# Patient Record
Sex: Male | Born: 1985 | Race: Black or African American | Hispanic: No | Marital: Single | Smoking: Never smoker
Health system: Southern US, Community
[De-identification: ages and names within clinical notes are randomized; demographics above are authoritative.]

---

## 2013-05-30 ENCOUNTER — Emergency Department (HOSPITAL_COMMUNITY)
Admission: EM | Admit: 2013-05-30 | Discharge: 2013-05-30 | Disposition: A | Discharge status: Home / Self Care | Payer: Self-pay | Source: Home / Self Care | Attending: Family Medicine | Admitting: Family Medicine

## 2013-05-30 ENCOUNTER — Encounter (HOSPITAL_COMMUNITY): Payer: Self-pay | Admitting: Emergency Medicine

## 2013-05-30 DIAGNOSIS — R21 Rash and other nonspecific skin eruption: Secondary | ICD-10-CM

## 2013-05-30 MED ORDER — TERBINAFINE HCL 250 MG PO TABS: 250.0000 mg | ORAL_TABLET | Freq: Every day | ORAL | Status: DC

## 2013-05-30 MED ORDER — PREDNISONE 10 MG PO TABS: ORAL_TABLET | ORAL | Status: DC

## 2013-05-30 MED ORDER — CETIRIZINE HCL 10 MG PO TABS: 10.0000 mg | ORAL_TABLET | Freq: Every day | ORAL | Status: AC

## 2013-05-30 NOTE — ED Provider Notes (Signed)
Medical screening examination/treatment/procedure(s) were performed by a resident physician or non-physician practitioner and as the supervising physician I was immediately available for consultation/collaboration.  Clementeen Graham, MD   Rodolph Bong, MD 05/30/13 2009

## 2013-05-30 NOTE — ED Provider Notes (Signed)
CSN: [id number]     Arrival date & time 05/30/13  1251 History   First MD Initiated Contact with Patient 05/30/13 1336     Chief Complaint  Patient presents with  . Rash    on set july.    (Consider location/radiation/quality/duration/timing/severity/associated sxs/prior Treatment) HPI Comments: 27 year old male presents complaining of itchy rash on his back and chest since July. It started as a single patch on the front of his chest has since spread out. It itches but does not hurt. There's been no discharge or crusting from the rash. The rash is flat and he cannot feel it with his hands, he just feels the itching. Denies any fever, chills, or any other symptoms. He has tried putting some sort of topical cream on it but this did not help.  Patient is a 27 y.o. male presenting with rash.  Rash Associated symptoms: no chest pain, no chills, no constipation, no cough, no diarrhea, no dysuria, no fatigue, no fever, no hematuria, no nausea, no shortness of breath, no sore throat and no vomiting     History reviewed. No pertinent past medical history. History reviewed. No pertinent past surgical history. History reviewed. No pertinent family history. History  Substance Use Topics  . Smoking status: Never Smoker   . Smokeless tobacco: Not on file  . Alcohol Use: Yes    Review of Systems  Constitutional: Negative for fever, chills and fatigue.  HENT: Negative for sore throat, neck pain and neck stiffness.   Eyes: Negative for visual disturbance.  Respiratory: Negative for cough and shortness of breath.   Cardiovascular: Negative for chest pain, palpitations and leg swelling.  Gastrointestinal: Negative for nausea, vomiting, abdominal pain, diarrhea and constipation.  Genitourinary: Negative for dysuria, urgency, frequency and hematuria.  Musculoskeletal: Negative for myalgias and arthralgias.  Skin: Positive for rash.  Neurological: Negative for dizziness, weakness and light-headedness.     Allergies  Review of patient's allergies indicates no known allergies.  Home Medications   Current Outpatient Rx  Name  Route  Sig  Dispense  Refill  . cetirizine (ZYRTEC) 10 MG tablet   Oral   Take 1 tablet (10 mg total) by mouth daily.   30 tablet   0   . predniSONE (DELTASONE) 10 MG tablet      4 tabs daily for 5 days;  3 tabs daily for 2 days;  2 tabs daily for 2 days   30 tablet   0   . terbinafine (LAMISIL) 250 MG tablet   Oral   Take 1 tablet (250 mg total) by mouth daily.   30 tablet   0    BP 138/81  Pulse 64  Temp(Src) 97.7 F (36.5 C) (Oral)  Resp 16  SpO2 97% Physical Exam  Nursing note and vitals reviewed. Constitutional: He is oriented to person, place, and time. He appears well-developed and well-nourished. No distress.  HENT:  Head: Normocephalic and atraumatic.  Mouth/Throat: Oropharynx is clear and moist.  Pulmonary/Chest: Effort normal. No respiratory distress.  Neurological: He is alert and oriented to person, place, and time. Coordination normal.  Skin: Skin is warm. Rash noted. Rash is macular (hyperpigmented, lacy macules in a linear distribution, symmetric about the trunk). He is not diaphoretic.  Psychiatric: He has a normal mood and affect. Judgment normal.    ED Course  Procedures (including critical care time) Labs Review Labs Reviewed - No data to display Imaging Review No results found.  MDM   1. Rash and  nonspecific skin eruption    This is most likely pityriasis rosea. He has limited funds and would prefer not to have followup so we'll treat for a fungal skin infection as well. If this does not improve the rash or he gets worse, he'll followup with Dr. Terri Piedra in dermatology   Meds ordered this encounter  Medications  . predniSONE (DELTASONE) 10 MG tablet    Sig: 4 tabs daily for 5 days;  3 tabs daily for 2 days;  2 tabs daily for 2 days    Dispense:  30 tablet    Refill:  0  . terbinafine (LAMISIL) 250 MG tablet     Sig: Take 1 tablet (250 mg total) by mouth daily.    Dispense:  30 tablet    Refill:  0  . cetirizine (ZYRTEC) 10 MG tablet    Sig: Take 1 tablet (10 mg total) by mouth daily.    Dispense:  30 tablet    Refill:  0       Graylon Good, PA-C 05/30/13 1419

## 2013-05-30 NOTE — ED Notes (Signed)
C/o rash on back and chest since July. Denies any changes in soaps or detergents. No change in diet.  Pt states that rash itches off/on. Pt has otc creams with no relief in symptoms.

## 2015-03-30 ENCOUNTER — Inpatient Hospital Stay (HOSPITAL_COMMUNITY)
Admission: EM | Admit: 2015-03-30 | Discharge: 2015-04-04 | DRG: 327 | Disposition: A | Discharge status: Home / Self Care | Payer: BLUE CROSS/BLUE SHIELD | Attending: Surgery | Admitting: Surgery

## 2015-03-30 ENCOUNTER — Emergency Department (HOSPITAL_COMMUNITY): Payer: BLUE CROSS/BLUE SHIELD

## 2015-03-30 ENCOUNTER — Encounter (HOSPITAL_COMMUNITY): Payer: Self-pay | Admitting: Emergency Medicine

## 2015-03-30 DIAGNOSIS — K275 Chronic or unspecified peptic ulcer, site unspecified, with perforation: Secondary | ICD-10-CM | POA: Diagnosis present

## 2015-03-30 DIAGNOSIS — K265 Chronic or unspecified duodenal ulcer with perforation: Principal | ICD-10-CM | POA: Diagnosis present

## 2015-03-30 DIAGNOSIS — K668 Other specified disorders of peritoneum: Secondary | ICD-10-CM | POA: Diagnosis present

## 2015-03-30 DIAGNOSIS — Z79899 Other long term (current) drug therapy: Secondary | ICD-10-CM

## 2015-03-30 DIAGNOSIS — R1084 Generalized abdominal pain: Secondary | ICD-10-CM | POA: Diagnosis not present

## 2015-03-30 DIAGNOSIS — R198 Other specified symptoms and signs involving the digestive system and abdomen: Secondary | ICD-10-CM

## 2015-03-30 DIAGNOSIS — N179 Acute kidney failure, unspecified: Secondary | ICD-10-CM | POA: Diagnosis not present

## 2015-03-30 MED ORDER — ONDANSETRON HCL 4 MG/2ML IJ SOLN
4.0000 mg | Freq: Once | INTRAMUSCULAR | Status: AC
  Administered 2015-03-31: 4 mg via INTRAVENOUS
  Filled 2015-03-30: qty 2

## 2015-03-30 MED ORDER — HYDROMORPHONE HCL 1 MG/ML IJ SOLN
1.0000 mg | Freq: Once | INTRAMUSCULAR | Status: AC
  Administered 2015-03-31: 1 mg via INTRAVENOUS
  Filled 2015-03-30: qty 1

## 2015-03-30 NOTE — ED Provider Notes (Signed)
CSN: [id number]     Arrival date & time 03/30/15  2309 History   First MD Initiated Contact with Patient 03/30/15 2332     Chief Complaint  Patient presents with  . Abdominal Pain     (Consider location/radiation/quality/duration/timing/severity/associated sxs/prior Treatment) HPI  This is a 29 year old male who is currently on an intermittent fast for Ramadan. He has had abdominal pain for the past week and a half. The abdominal pain acutely worsened this evening about 6 PM. He describes it as a burning pain and rates it a 10 out of 10. It is worse with movement or palpation. He had multiple episodes of vomiting prior to arrival but is not vomiting at the present time. He denies diarrhea or dysuria. His abdomen is not distended.  History reviewed. No pertinent past medical history. History reviewed. No pertinent past surgical history. History reviewed. No pertinent family history. History  Substance Use Topics  . Smoking status: Never Smoker   . Smokeless tobacco: Not on file  . Alcohol Use: Yes    Review of Systems  All other systems reviewed and are negative.   Allergies  Review of patient's allergies indicates no known allergies.  Home Medications   Prior to Admission medications   Medication Sig Start Date End Date Taking? Authorizing Provider  ranitidine (ZANTAC) 150 MG tablet Take 150 mg by mouth once.   Yes Historical Provider, MD  cetirizine (ZYRTEC) 10 MG tablet Take 1 tablet (10 mg total) by mouth daily. Patient not taking: Reported on 03/31/2015 05/30/13   Graylon Good, PA-C  predniSONE (DELTASONE) 10 MG tablet 4 tabs daily for 5 days;  3 tabs daily for 2 days;  2 tabs daily for 2 days Patient not taking: Reported on 03/31/2015 05/30/13   Graylon Good, PA-C  terbinafine (LAMISIL) 250 MG tablet Take 1 tablet (250 mg total) by mouth daily. Patient not taking: Reported on 03/31/2015 05/30/13   Graylon Good, PA-C   BP 110/62 mmHg  Pulse 75  Temp(Src) 99.1 F (37.3  C) (Oral)  Resp 18  SpO2 99%   Physical Exam  General: Well-developed, well-nourished male in no acute distress; appearance consistent with age of record HENT: normocephalic; atraumatic Eyes: pupils equal, round and reactive to light; extraocular muscles intact Neck: supple Heart: regular rate and rhythm Lungs: clear to auscultation bilaterally Abdomen: soft; nondistended; diffuse tenderness with voluntary guarding; bowel sounds hypoactive Extremities: No deformity; full range of motion; pulses normal Neurologic: Awake, alert; motor function intact in all extremities and symmetric; no facial droop Skin: Warm and dry Psychiatric: Appears uncomfortable    ED Course  Procedures (including critical care time)   MDM  Nursing notes and vitals signs, including pulse oximetry, reviewed.  Summary of this visit's results, reviewed by myself:  Labs:  Results for orders placed or performed during the hospital encounter of 03/30/15 (from the past 24 hour(s))  CBC with Differential     Status: Abnormal   Collection Time: 03/30/15 11:41 PM  Result Value Ref Range   WBC 10.1 4.0 - 10.5 K/uL   RBC 5.23 4.22 - 5.81 MIL/uL   Hemoglobin 15.1 13.0 - 17.0 g/dL   HCT 01.0 27.2 - 53.6 %   MCV 87.0 78.0 - 100.0 fL   MCH 28.9 26.0 - 34.0 pg   MCHC 33.2 30.0 - 36.0 g/dL   RDW 64.4 03.4 - 74.2 %   Platelets 211 150 - 400 K/uL   Neutrophils Relative % 86 (H) 43 -  77 %   Neutro Abs 8.7 (H) 1.7 - 7.7 K/uL   Lymphocytes Relative 9 (L) 12 - 46 %   Lymphs Abs 0.9 0.7 - 4.0 K/uL   Monocytes Relative 5 3 - 12 %   Monocytes Absolute 0.5 0.1 - 1.0 K/uL   Eosinophils Relative 0 0 - 5 %   Eosinophils Absolute 0.0 0.0 - 0.7 K/uL   Basophils Relative 0 0 - 1 %   Basophils Absolute 0.0 0.0 - 0.1 K/uL  Comprehensive metabolic panel     Status: Abnormal   Collection Time: 03/30/15 11:41 PM  Result Value Ref Range   Sodium 136 135 - 145 mmol/L   Potassium 4.1 3.5 - 5.1 mmol/L   Chloride 101 101 - 111  mmol/L   CO2 26 22 - 32 mmol/L   Glucose, Bld 121 (H) 65 - 99 mg/dL   BUN 20 6 - 20 mg/dL   Creatinine, Ser 7.821.41 (H) 0.61 - 1.24 mg/dL   Calcium 9.3 8.9 - 95.610.3 mg/dL   Total Protein 7.7 6.5 - 8.1 g/dL   Albumin 4.5 3.5 - 5.0 g/dL   AST 21 15 - 41 U/L   ALT 15 (L) 17 - 63 U/L   Alkaline Phosphatase 64 38 - 126 U/L   Total Bilirubin 0.9 0.3 - 1.2 mg/dL   GFR calc non Af Amer >60 >60 mL/min   GFR calc Af Amer >60 >60 mL/min   Anion gap 9 5 - 15  Lipase, blood     Status: Abnormal   Collection Time: 03/30/15 11:41 PM  Result Value Ref Range   Lipase 57 (H) 22 - 51 U/L  Protime-INR     Status: None   Collection Time: 03/30/15 11:41 PM  Result Value Ref Range   Prothrombin Time 14.2 11.6 - 15.2 seconds   INR 1.08 0.00 - 1.49  Urinalysis, Routine w reflex microscopic ([facility])     Status: Abnormal   Collection Time: 03/31/15  3:58 AM  Result Value Ref Range   Color, Urine YELLOW YELLOW   APPearance CLEAR CLEAR   Specific Gravity, Urine 1.041 (H) 1.005 - 1.030   pH 8.0 5.0 - 8.0   Glucose, UA NEGATIVE NEGATIVE mg/dL   Hgb urine dipstick NEGATIVE NEGATIVE   Bilirubin Urine NEGATIVE NEGATIVE   Ketones, ur NEGATIVE NEGATIVE mg/dL   Protein, ur NEGATIVE NEGATIVE mg/dL   Urobilinogen, UA 1.0 0.0 - 1.0 mg/dL   Nitrite NEGATIVE NEGATIVE   Leukocytes, UA NEGATIVE NEGATIVE  Surgical pcr screen     Status: Abnormal   Collection Time: 03/31/15  4:36 AM  Result Value Ref Range   MRSA, PCR NEGATIVE NEGATIVE   Staphylococcus aureus POSITIVE (A) NEGATIVE    Imaging Studies: Ct Abdomen Pelvis W Contrast  03/31/2015   CLINICAL DATA:  Diffuse abdominal pain  EXAM: CT ABDOMEN AND PELVIS WITH CONTRAST  TECHNIQUE: Multidetector CT imaging of the abdomen and pelvis was performed using the standard protocol following bolus administration of intravenous contrast.  CONTRAST:  50mL OMNIPAQUE IOHEXOL 300 MG/ML SOLN, 100mL OMNIPAQUE IOHEXOL 300 MG/ML SOLN  COMPARISON:  Radiographs 03/31/2015   FINDINGS: There is free intraperitoneal air. The source of the free air is not evident. The stomach and duodenum are well filled with oral contrast, and none of the contrast has leaked out into an extraluminal location. No focal inflammatory changes are evident. There is no bowel obstruction. There is a small volume ascites.  The parenchymal organs appear unremarkable.  There is no significant abnormality in the lower chest.  There is no significant musculoskeletal abnormality.  IMPRESSION: Free intraperitoneal air. Small volume ascites. These results were called by telephone at the time of interpretation on 03/31/2015 at 3:57 am to Dr. Paula Libra , who verbally acknowledged these results.   Electronically Signed   By: Ellery Plunk M.D.   On: 03/31/2015 03:59   Dg Abd Acute W/chest  03/31/2015   CLINICAL DATA:  Abdominal pain, nausea, fever and vomiting for 1 day.  EXAM: DG ABDOMEN ACUTE W/ 1V CHEST  COMPARISON:  None.  FINDINGS: There is no evidence of dilated bowel loops or free intraperitoneal air. No radiopaque calculi or other significant radiographic abnormality is seen. Heart size and mediastinal contours are within normal limits. Both lungs are clear except for a benign appearing calcified focus in the lateral left lung which may be a granuloma.  IMPRESSION: Negative abdominal radiographs.  No acute cardiopulmonary disease.   Electronically Signed   By: Ellery Plunk M.D.   On: 03/31/2015 01:06   Dr. Wenda Low to take to operating room for ex-lap.      Paula Libra, MD 03/31/15 [phone]

## 2015-03-30 NOTE — ED Notes (Signed)
Pt reports diffuse abdominal pain described as "burning" starting a few days ago. Has been fasting recently (eats food at 0830 and cannot eat again until 0400 am the next morning). Has had decreased PO intake after the fast d/t emesis and nausea. Denies urinary or bowel changes. No other c/c. Patient appears to be in apparent distress. No active vomiting.

## 2015-03-31 ENCOUNTER — Encounter (HOSPITAL_COMMUNITY): Admission: EM | Disposition: A | Discharge status: Home / Self Care | Payer: Self-pay | Source: Home / Self Care

## 2015-03-31 ENCOUNTER — Emergency Department (HOSPITAL_COMMUNITY): Payer: BLUE CROSS/BLUE SHIELD | Admitting: Anesthesiology

## 2015-03-31 ENCOUNTER — Emergency Department (HOSPITAL_COMMUNITY): Payer: BLUE CROSS/BLUE SHIELD

## 2015-03-31 ENCOUNTER — Ambulatory Visit: Payer: Self-pay | Admitting: Surgery

## 2015-03-31 ENCOUNTER — Encounter (HOSPITAL_COMMUNITY): Payer: Self-pay | Admitting: Anesthesiology

## 2015-03-31 DIAGNOSIS — K265 Chronic or unspecified duodenal ulcer with perforation: Secondary | ICD-10-CM | POA: Diagnosis present

## 2015-03-31 DIAGNOSIS — Z79899 Other long term (current) drug therapy: Secondary | ICD-10-CM | POA: Diagnosis not present

## 2015-03-31 DIAGNOSIS — R1084 Generalized abdominal pain: Secondary | ICD-10-CM | POA: Diagnosis present

## 2015-03-31 DIAGNOSIS — K668 Other specified disorders of peritoneum: Secondary | ICD-10-CM | POA: Diagnosis present

## 2015-03-31 DIAGNOSIS — N179 Acute kidney failure, unspecified: Secondary | ICD-10-CM | POA: Diagnosis not present

## 2015-03-31 DIAGNOSIS — K275 Chronic or unspecified peptic ulcer, site unspecified, with perforation: Secondary | ICD-10-CM | POA: Diagnosis present

## 2015-03-31 HISTORY — PX: LAPAROSCOPIC ABDOMINAL EXPLORATION: SHX6249

## 2015-03-31 LAB — LIPASE, BLOOD: LIPASE: 57 U/L — AB (ref 22–51)

## 2015-03-31 LAB — COMPREHENSIVE METABOLIC PANEL
ALT: 15 U/L — AB (ref 17–63)
AST: 21 U/L (ref 15–41)
Albumin: 4.5 g/dL (ref 3.5–5.0)
Alkaline Phosphatase: 64 U/L (ref 38–126)
Anion gap: 9 (ref 5–15)
BUN: 20 mg/dL (ref 6–20)
CHLORIDE: 101 mmol/L (ref 101–111)
CO2: 26 mmol/L (ref 22–32)
CREATININE: 1.41 mg/dL — AB (ref 0.61–1.24)
Calcium: 9.3 mg/dL (ref 8.9–10.3)
GFR calc Af Amer: >60 mL/min (ref >60)
Glucose, Bld: 121 mg/dL — ABNORMAL HIGH (ref 65–99)
Potassium: 4.1 mmol/L (ref 3.5–5.1)
SODIUM: 136 mmol/L (ref 135–145)
Total Bilirubin: 0.9 mg/dL (ref 0.3–1.2)
Total Protein: 7.7 g/dL (ref 6.5–8.1)

## 2015-03-31 LAB — CBC WITH DIFFERENTIAL/PLATELET
Basophils Absolute: 0 10*3/uL (ref 0.0–0.1)
Basophils Relative: 0 % (ref 0–1)
EOS ABS: 0 10*3/uL (ref 0.0–0.7)
Eosinophils Relative: 0 % (ref 0–5)
HEMATOCRIT: 45.5 % (ref 39.0–52.0)
HEMOGLOBIN: 15.1 g/dL (ref 13.0–17.0)
LYMPHS PCT: 9 % — AB (ref 12–46)
Lymphs Abs: 0.9 10*3/uL (ref 0.7–4.0)
MCH: 28.9 pg (ref 26.0–34.0)
MCHC: 33.2 g/dL (ref 30.0–36.0)
MCV: 87 fL (ref 78.0–100.0)
Monocytes Absolute: 0.5 10*3/uL (ref 0.1–1.0)
Monocytes Relative: 5 % (ref 3–12)
Neutro Abs: 8.7 10*3/uL — ABNORMAL HIGH (ref 1.7–7.7)
Neutrophils Relative %: 86 % — ABNORMAL HIGH (ref 43–77)
Platelets: 211 10*3/uL (ref 150–400)
RBC: 5.23 MIL/uL (ref 4.22–5.81)
RDW: 13.3 % (ref 11.5–15.5)
WBC: 10.1 10*3/uL (ref 4.0–10.5)

## 2015-03-31 LAB — CBC
HCT: 41.9 % (ref 39.0–52.0)
HEMOGLOBIN: 13.9 g/dL (ref 13.0–17.0)
MCH: 29.1 pg (ref 26.0–34.0)
MCHC: 33.2 g/dL (ref 30.0–36.0)
MCV: 87.8 fL (ref 78.0–100.0)
Platelets: 217 10*3/uL (ref 150–400)
RBC: 4.77 MIL/uL (ref 4.22–5.81)
RDW: 13.6 % (ref 11.5–15.5)
WBC: 11.6 10*3/uL — ABNORMAL HIGH (ref 4.0–10.5)

## 2015-03-31 LAB — URINALYSIS, ROUTINE W REFLEX MICROSCOPIC
Bilirubin Urine: NEGATIVE
GLUCOSE, UA: NEGATIVE mg/dL
HGB URINE DIPSTICK: NEGATIVE
Ketones, ur: NEGATIVE mg/dL
Leukocytes, UA: NEGATIVE
NITRITE: NEGATIVE
PH: 8 (ref 5.0–8.0)
Protein, ur: NEGATIVE mg/dL
Specific Gravity, Urine: 1.041 — ABNORMAL HIGH (ref 1.005–1.030)
Urobilinogen, UA: 1 mg/dL (ref 0.0–1.0)

## 2015-03-31 LAB — SURGICAL PCR SCREEN
MRSA, PCR: NEGATIVE
STAPHYLOCOCCUS AUREUS: POSITIVE — AB

## 2015-03-31 LAB — CREATININE, SERUM
CREATININE: 1.16 mg/dL (ref 0.61–1.24)
GFR calc Af Amer: >60 mL/min (ref >60)

## 2015-03-31 LAB — PROTIME-INR
INR: 1.08 (ref 0.00–1.49)
PROTHROMBIN TIME: 14.2 s (ref 11.6–15.2)

## 2015-03-31 SURGERY — EXPLORATION, ABDOMEN, LAPAROSCOPIC
Anesthesia: General

## 2015-03-31 MED ORDER — FENTANYL CITRATE (PF) 100 MCG/2ML IJ SOLN
INTRAMUSCULAR | Status: DC | PRN
  Administered 2015-03-31 (×3): 50 ug via INTRAVENOUS

## 2015-03-31 MED ORDER — SUGAMMADEX SODIUM 200 MG/2ML IV SOLN
INTRAVENOUS | Status: AC
  Filled 2015-03-31: qty 2

## 2015-03-31 MED ORDER — FENTANYL CITRATE (PF) 250 MCG/5ML IJ SOLN
INTRAMUSCULAR | Status: AC
  Filled 2015-03-31: qty 5

## 2015-03-31 MED ORDER — LIDOCAINE HCL (CARDIAC) 20 MG/ML IV SOLN
INTRAVENOUS | Status: DC | PRN
  Administered 2015-03-31: 80 mg via INTRAVENOUS

## 2015-03-31 MED ORDER — HYDROMORPHONE HCL 1 MG/ML IJ SOLN
INTRAMUSCULAR | Status: AC
  Filled 2015-03-31: qty 1

## 2015-03-31 MED ORDER — ONDANSETRON HCL 4 MG PO TABS: 4.0000 mg | ORAL_TABLET | Freq: Four times a day (QID) | ORAL | Status: DC | PRN

## 2015-03-31 MED ORDER — PANTOPRAZOLE SODIUM 40 MG IV SOLR
40.0000 mg | Freq: Two times a day (BID) | INTRAVENOUS | Status: DC
  Administered 2015-03-31 – 2015-04-04 (×8): 40 mg via INTRAVENOUS
  Filled 2015-03-31 (×11): qty 40

## 2015-03-31 MED ORDER — LACTATED RINGERS IR SOLN
Status: DC | PRN
  Administered 2015-03-31: 4000 mL

## 2015-03-31 MED ORDER — ROCURONIUM BROMIDE 100 MG/10ML IV SOLN
INTRAVENOUS | Status: AC
Start: 2015-03-31 — End: 2015-03-31
  Filled 2015-03-31: qty 1

## 2015-03-31 MED ORDER — SODIUM CHLORIDE 0.9 % IV SOLN
1.0000 g | Freq: Once | INTRAVENOUS | Status: AC
  Administered 2015-03-31: 1 g via INTRAVENOUS
  Filled 2015-03-31: qty 1

## 2015-03-31 MED ORDER — DEXAMETHASONE SODIUM PHOSPHATE 10 MG/ML IJ SOLN
INTRAMUSCULAR | Status: DC | PRN
  Administered 2015-03-31: 10 mg via INTRAVENOUS

## 2015-03-31 MED ORDER — IOHEXOL 300 MG/ML  SOLN
100.0000 mL | Freq: Once | INTRAMUSCULAR | Status: AC | PRN
  Administered 2015-03-31: 100 mL via INTRAVENOUS

## 2015-03-31 MED ORDER — SODIUM CHLORIDE 0.9 % IV BOLUS (SEPSIS)
1000.0000 mL | Freq: Once | INTRAVENOUS | Status: AC
  Administered 2015-03-31: 1000 mL via INTRAVENOUS

## 2015-03-31 MED ORDER — PROPOFOL 10 MG/ML IV BOLUS
INTRAVENOUS | Status: DC | PRN
  Administered 2015-03-31: 200 mg via INTRAVENOUS

## 2015-03-31 MED ORDER — HEPARIN SODIUM (PORCINE) 5000 UNIT/ML IJ SOLN
5000.0000 [IU] | Freq: Three times a day (TID) | INTRAMUSCULAR | Status: DC
  Administered 2015-04-01 (×3): 5000 [IU] via SUBCUTANEOUS
  Filled 2015-03-31 (×7): qty 1

## 2015-03-31 MED ORDER — SODIUM CHLORIDE 0.9 % IV SOLN
1.0000 g | INTRAVENOUS | Status: AC
  Administered 2015-04-01: 1 g via INTRAVENOUS
  Filled 2015-03-31: qty 1

## 2015-03-31 MED ORDER — LIP MEDEX EX OINT
TOPICAL_OINTMENT | CUTANEOUS | Status: AC
  Administered 2015-03-31: 1
  Filled 2015-03-31: qty 7

## 2015-03-31 MED ORDER — IOHEXOL 300 MG/ML  SOLN
50.0000 mL | Freq: Once | INTRAMUSCULAR | Status: AC | PRN
  Administered 2015-03-31: 50 mL via ORAL

## 2015-03-31 MED ORDER — PROPOFOL 10 MG/ML IV BOLUS
INTRAVENOUS | Status: AC
  Filled 2015-03-31: qty 20

## 2015-03-31 MED ORDER — HYDROMORPHONE HCL 1 MG/ML IJ SOLN
1.0000 mg | Freq: Once | INTRAMUSCULAR | Status: AC
  Administered 2015-03-31: 1 mg via INTRAVENOUS
  Filled 2015-03-31: qty 1

## 2015-03-31 MED ORDER — BUPIVACAINE LIPOSOME 1.3 % IJ SUSP
20.0000 mL | Freq: Once | INTRAMUSCULAR | Status: AC
  Administered 2015-03-31: 20 mL
  Filled 2015-03-31: qty 20

## 2015-03-31 MED ORDER — ONDANSETRON HCL 4 MG/2ML IJ SOLN
INTRAMUSCULAR | Status: DC | PRN
  Administered 2015-03-31: 4 mg via INTRAVENOUS

## 2015-03-31 MED ORDER — ROCURONIUM BROMIDE 100 MG/10ML IV SOLN
INTRAVENOUS | Status: DC | PRN
  Administered 2015-03-31 (×2): 10 mg via INTRAVENOUS
  Administered 2015-03-31: 30 mg via INTRAVENOUS
  Administered 2015-03-31: 20 mg via INTRAVENOUS

## 2015-03-31 MED ORDER — SUCCINYLCHOLINE CHLORIDE 20 MG/ML IJ SOLN
INTRAMUSCULAR | Status: DC | PRN
  Administered 2015-03-31: 100 mg via INTRAVENOUS

## 2015-03-31 MED ORDER — SUGAMMADEX SODIUM 200 MG/2ML IV SOLN
INTRAVENOUS | Status: DC | PRN
  Administered 2015-03-31: 200 mg via INTRAVENOUS

## 2015-03-31 MED ORDER — ONDANSETRON HCL 4 MG/2ML IJ SOLN: 4.0000 mg | Freq: Four times a day (QID) | INTRAMUSCULAR | Status: DC | PRN

## 2015-03-31 MED ORDER — KCL IN DEXTROSE-NACL 20-5-0.45 MEQ/L-%-% IV SOLN
INTRAVENOUS | Status: DC
  Administered 2015-03-31 – 2015-04-01 (×2): via INTRAVENOUS
  Administered 2015-04-01: 100 mL/h via INTRAVENOUS
  Administered 2015-04-02 – 2015-04-03 (×3): via INTRAVENOUS
  Filled 2015-03-31 (×11): qty 1000

## 2015-03-31 MED ORDER — MIDAZOLAM HCL 2 MG/2ML IJ SOLN
INTRAMUSCULAR | Status: AC
  Filled 2015-03-31: qty 2

## 2015-03-31 MED ORDER — LIDOCAINE HCL (CARDIAC) 20 MG/ML IV SOLN
INTRAVENOUS | Status: AC
Start: 2015-03-31 — End: 2015-03-31
  Filled 2015-03-31: qty 5

## 2015-03-31 MED ORDER — HYDROMORPHONE HCL 1 MG/ML IJ SOLN
0.2500 mg | INTRAMUSCULAR | Status: DC | PRN
  Administered 2015-03-31 (×2): 0.5 mg via INTRAVENOUS

## 2015-03-31 MED ORDER — MORPHINE SULFATE 2 MG/ML IJ SOLN
1.0000 mg | INTRAMUSCULAR | Status: DC | PRN
  Administered 2015-03-31 – 2015-04-02 (×7): 1 mg via INTRAVENOUS
  Filled 2015-03-31 (×7): qty 1

## 2015-03-31 MED ORDER — LACTATED RINGERS IV SOLN
INTRAVENOUS | Status: DC | PRN
  Administered 2015-03-31 (×2): via INTRAVENOUS

## 2015-03-31 MED ORDER — PROMETHAZINE HCL 25 MG/ML IJ SOLN: 6.2500 mg | INTRAMUSCULAR | Status: DC | PRN

## 2015-03-31 SURGICAL SUPPLY — 33 items
BENZOIN TINCTURE PRP APPL 2/3 (GAUZE/BANDAGES/DRESSINGS) IMPLANT
CLOSURE WOUND 1/2 X4 (GAUZE/BANDAGES/DRESSINGS)
COVER SURGICAL LIGHT HANDLE (MISCELLANEOUS) IMPLANT
DECANTER SPIKE VIAL GLASS SM (MISCELLANEOUS) IMPLANT
DRAIN CHANNEL 19F RND (DRAIN) ×3 IMPLANT
DRAPE LAPAROSCOPIC ABDOMINAL (DRAPES) ×3 IMPLANT
ELECT REM PT RETURN 9FT ADLT (ELECTROSURGICAL) ×3
ELECTRODE REM PT RTRN 9FT ADLT (ELECTROSURGICAL) ×1 IMPLANT
EVACUATOR SILICONE 100CC (DRAIN) ×3 IMPLANT
GLOVE BIOGEL M 8.0 STRL (GLOVE) ×18 IMPLANT
GLOVE BIOGEL PI IND STRL 7.0 (GLOVE) ×1 IMPLANT
GLOVE BIOGEL PI INDICATOR 7.0 (GLOVE) ×2
GOWN SPEC L4 XLG W/TWL (GOWN DISPOSABLE) IMPLANT
GOWN STRL REUS W/TWL LRG LVL3 (GOWN DISPOSABLE) ×9 IMPLANT
GOWN STRL REUS W/TWL XL LVL3 (GOWN DISPOSABLE) IMPLANT
LIQUID BAND (GAUZE/BANDAGES/DRESSINGS) ×3 IMPLANT
PACK COLON (CUSTOM PROCEDURE TRAY) ×3 IMPLANT
SET IRRIG TUBING LAPAROSCOPIC (IRRIGATION / IRRIGATOR) ×3 IMPLANT
SHEARS HARMONIC ACE PLUS 36CM (ENDOMECHANICALS) IMPLANT
SLEEVE SURGEON STRL (DRAPES) ×3 IMPLANT
SLEEVE XCEL OPT CAN 5 100 (ENDOMECHANICALS) ×6 IMPLANT
SLEEVE Z-THREAD 5X100MM (TROCAR) IMPLANT
SOLUTION ANTI FOG 6CC (MISCELLANEOUS) IMPLANT
STRIP CLOSURE SKIN 1/2X4 (GAUZE/BANDAGES/DRESSINGS) IMPLANT
SUT ETHILON 2 0 PS N (SUTURE) ×3 IMPLANT
SUT SILK 2 0 SH (SUTURE) ×9 IMPLANT
SUT VIC AB 4-0 SH 18 (SUTURE) ×3 IMPLANT
SYR 30ML LL (SYRINGE) ×3 IMPLANT
TRAY FOLEY W/METER SILVER 14FR (SET/KITS/TRAYS/PACK) ×3 IMPLANT
TROCAR BLADELESS OPT 5 100 (ENDOMECHANICALS) ×3 IMPLANT
TROCAR XCEL NON-BLD 11X100MML (ENDOMECHANICALS) ×3 IMPLANT
TROCAR XCEL UNIV SLVE 11M 100M (ENDOMECHANICALS) IMPLANT
TUBING INSUFFLATION 10FT LAP (TUBING) IMPLANT

## 2015-03-31 NOTE — Transfer of Care (Signed)
Immediate Anesthesia Transfer of Care Note  Patient: Dennis Cannon  Procedure(s) Performed: Procedure(s): LAPAROSCOPIC ABDOMINAL EXPLORATION (N/A)  Patient Location: PACU  Anesthesia Type:General  Level of Consciousness: awake, alert  and oriented  Airway & Oxygen Therapy: Patient Spontanous Breathing and Patient connected to face mask oxygen  Post-op Assessment: Report given to RN and Post -op Vital signs reviewed and stable  Post vital signs: Reviewed and stable  Last Vitals:  Filed Vitals:   03/31/15 0930  BP: 123/79  Pulse: 80  Temp:   Resp: 18    Complications: No apparent anesthesia complications

## 2015-03-31 NOTE — ED Notes (Signed)
Pt began to drink oral contrast for CT scan and immediately started moaning in pain. Pain 10/10 in abdomen. Discussed pt condition with MD and advised to send pt for CT scan of abdomen STAT. CT Tech not present however other radiology techs to send CT tech ASAP to take pt for CT scan with IV contrast. Plan of care discussed with pt.

## 2015-03-31 NOTE — ED Notes (Signed)
Patient transported to CT 

## 2015-03-31 NOTE — Op Note (Signed)
Surgeon: Wenda LowMatt Lake Cinquemani, MD, FACS  Asst:  none  Anes:  general  Procedure: Laparoscopic closure of perforated duodenal ulcer  Diagnosis: Perforated duodenal ulcer  Complications: none  EBL:   Minimal  cc  Drains: 19 Blake drain out the right trocar site  Description of Procedure:  [facility]  After anesthesia was administered and the patient was prepped a timeout was performed.  Access achieved with 5 mm Optiview through the left upper quadrant.  This was subsequently upgraded to a 11 mm trocar.  Two other 5 mm trocars were place.  Thin yellowish green liquid was around the liver, along the right gutter and in the pelvis.  This was aspirated.  The appendix was plump but not primarily inflamed.    The stomach and duodenum were visualized and there was a solitary perforation of the duodenum just beyond the pylorus.  Two sutures were place full thickness (2-0 silk) and tied laparoscopically.  This closed the defect.  The very thin omentum was brought up and secured to the stomach with a single 2-0 silk and tucked between the liver and the closure.  A 19 Blake drain was brought out on the right side and secured with a 2-0 nylon.  The two remaining incisions were closed with 4-0 vicryl after Exparel had been injected.    The patient tolerated the procedure well and was taken to the PACU in stable condition.     Matt B. Daphine DeutscherMartin, MD, [facility], [place] [phone]

## 2015-03-31 NOTE — ED Notes (Signed)
Attempted to call report to BenjaminGlen, RN at OR but did answer call. Will try again. Pt being transported to OR on stretcher. Personal items given to friend.

## 2015-03-31 NOTE — ED Notes (Signed)
Awake. Verbally responsive. Resp even and unlabored. No audible adventitious breath sounds noted. ABC's intact. Abd soft/nondistended but tender to palpate. No N/V/D reported at this time. IV saline lock patent and intact. Family at bedside.

## 2015-03-31 NOTE — ED Notes (Signed)
Awake. Verbally responsive. Resp even and unlabored. No audible adventitious breath sounds noted. ABC's intact. No N/V/D reported. IV SL patent and intact. Family at bedside. No adverse reaction to IV ABT noted.

## 2015-03-31 NOTE — Anesthesia Procedure Notes (Signed)
Procedure Name: Intubation Date/Time: 03/31/2015 10:09 AM Performed by: Thornell MuleSTUBBLEFIELD, Izzabella Besse G Pre-anesthesia Checklist: Patient identified, Emergency Drugs available, Suction available and Patient being monitored Patient Re-evaluated:Patient Re-evaluated prior to inductionOxygen Delivery Method: Circle System Utilized Preoxygenation: Pre-oxygenation with 100% oxygen Intubation Type: IV induction Ventilation: Mask ventilation without difficulty Laryngoscope Size: Miller and 3 Grade View: Grade I Tube type: Oral Tube size: 7.5 mm Number of attempts: 1 Airway Equipment and Method: Stylet and Oral airway Placement Confirmation: ETT inserted through vocal cords under direct vision,  positive ETCO2 and breath sounds checked- equal and bilateral Secured at: 21 cm Tube secured with: Tape Dental Injury: Teeth and Oropharynx as per pre-operative assessment

## 2015-03-31 NOTE — ED Notes (Signed)
Pt awaiting surgeon and bedside consult.

## 2015-03-31 NOTE — Anesthesia Preprocedure Evaluation (Addendum)
Anesthesia Evaluation  Patient identified by MRN, date of birth, ID band Patient awake  General Assessment Comment:There is free intraperitoneal air. The source of the free air is not evident. The stomach and duodenum are well filled with oral contrast, and none of the contrast has leaked out into an extraluminal location. No focal inflammatory changes are evident. There is no bowel obstruction. There is a small volume ascites  Reviewed: Allergy & Precautions, NPO status , Patient's Chart, lab work & pertinent test results  Airway Mallampati: II  TM Distance: >3 FB Neck ROM: Full    Dental no notable dental hx.    Pulmonary neg pulmonary ROS,  breath sounds clear to auscultation  Pulmonary exam normal       Cardiovascular negative cardio ROS Normal cardiovascular examRhythm:Regular Rate:Normal     Neuro/Psych negative neurological ROS  negative psych ROS   GI/Hepatic negative GI ROS, Neg liver ROS,   Endo/Other  negative endocrine ROS  Renal/GU negative Renal ROS  negative genitourinary   Musculoskeletal negative musculoskeletal ROS (+)   Abdominal   Peds negative pediatric ROS (+)  Hematology negative hematology ROS (+)   Anesthesia Other Findings   Reproductive/Obstetrics negative OB ROS                             Anesthesia Physical Anesthesia Plan  ASA: II and emergent  Anesthesia Plan: General   Post-op Pain Management:    Induction: Intravenous, Rapid sequence and Cricoid pressure planned  Airway Management Planned: Oral ETT  Additional Equipment:   Intra-op Plan:   Post-operative Plan: Extubation in OR  Informed Consent: I have reviewed the patients History and Physical, chart, labs and discussed the procedure including the risks, benefits and alternatives for the proposed anesthesia with the patient or authorized representative who has indicated his/her understanding  and acceptance.   Dental advisory given  Plan Discussed with: CRNA and Surgeon  Anesthesia Plan Comments:         Anesthesia Quick Evaluation

## 2015-03-31 NOTE — H&P (Signed)
Chief Complaint:  Abdominal pain for several days  History of Present Illness:  Dennis Cannon is an 29 y.o. male African man who has been fasting for Ramidan.  Over the last several days he has developed increasing abdominal pain.  He describes it as generalized.  His bowel habits are to have BMs 2-3 times/week.  He doesn't eat much.    I discussed the free air findings with him and his colleague.  I explained this in detail and offered him a nonsurgical option of antibiotics and a surgical option that included laparoscopy and possible laparotomy.  He chose the latter.    History reviewed. No pertinent past medical history.  History reviewed. No pertinent past surgical history.  No current facility-administered medications for this encounter.   Current Outpatient Prescriptions  Medication Sig Dispense Refill  . ranitidine (ZANTAC) 150 MG tablet Take 150 mg by mouth once.    . cetirizine (ZYRTEC) 10 MG tablet Take 1 tablet (10 mg total) by mouth daily. (Patient not taking: Reported on 03/31/2015) 30 tablet 0  . predniSONE (DELTASONE) 10 MG tablet 4 tabs daily for 5 days;  3 tabs daily for 2 days;  2 tabs daily for 2 days (Patient not taking: Reported on 03/31/2015) 30 tablet 0  . terbinafine (LAMISIL) 250 MG tablet Take 1 tablet (250 mg total) by mouth daily. (Patient not taking: Reported on 03/31/2015) 30 tablet 0   Review of patient's allergies indicates no known allergies. History reviewed. No pertinent family history. Social History:   reports that he has never smoked. He does not have any smokeless tobacco history on file. He reports that he drinks alcohol. He reports that he does not use illicit drugs.   REVIEW OF SYSTEMS : Negative except for his abdominal pain  Physical Exam:   Blood pressure 110/62, pulse 75, temperature 99.1 F (37.3 C), temperature source Oral, resp. rate 18, SpO2 99 %. There is no height or weight on file to calculate BMI.  Gen:  WDWN African Male  NAD   Neurological: Alert and oriented to person, place, and time. Motor and sensory function is grossly intact  Head: Normocephalic and atraumatic.  Eyes: Conjunctivae are normal. Pupils are equal, round, and reactive to light. No scleral icterus.  Neck: Normal range of motion. Neck supple. No tracheal deviation or thyromegaly present.  Cardiovascular:  Sinus tach without murmurs or gallops.  No carotid bruits Breast:  Not examined Respiratory: Effort normal.  No respiratory distress. No chest wall tenderness. Breath sounds normal.  No wheezes, rales or rhonchi.  Abdomen:  Flat but tender to palpation throughout; more so in the pelvis GU:  Not examined Musculoskeletal: Normal range of motion. Extremities are nontender. No cyanosis, edema or clubbing noted Lymphadenopathy: No cervical, preauricular, postauricular or axillary adenopathy is present Skin: Skin is warm and dry. No rash noted. No diaphoresis. No erythema. No pallor. Pscyh: Normal mood and affect. Behavior is normal. Judgment and thought content normal.   LABORATORY RESULTS: Results for orders placed or performed during the hospital encounter of 03/30/15 (from the past 48 hour(s))  CBC with Differential     Status: Abnormal   Collection Time: 03/30/15 11:41 PM  Result Value Ref Range   WBC 10.1 4.0 - 10.5 K/uL   RBC 5.23 4.22 - 5.81 MIL/uL   Hemoglobin 15.1 13.0 - 17.0 g/dL   HCT 45.5 39.0 - 52.0 %   MCV 87.0 78.0 - 100.0 fL   MCH 28.9 26.0 - 34.0 pg  MCHC 33.2 30.0 - 36.0 g/dL   RDW 13.3 11.5 - 15.5 %   Platelets 211 150 - 400 K/uL   Neutrophils Relative % 86 (H) 43 - 77 %   Neutro Abs 8.7 (H) 1.7 - 7.7 K/uL   Lymphocytes Relative 9 (L) 12 - 46 %   Lymphs Abs 0.9 0.7 - 4.0 K/uL   Monocytes Relative 5 3 - 12 %   Monocytes Absolute 0.5 0.1 - 1.0 K/uL   Eosinophils Relative 0 0 - 5 %   Eosinophils Absolute 0.0 0.0 - 0.7 K/uL   Basophils Relative 0 0 - 1 %   Basophils Absolute 0.0 0.0 - 0.1 K/uL  Comprehensive metabolic  panel     Status: Abnormal   Collection Time: 03/30/15 11:41 PM  Result Value Ref Range   Sodium 136 135 - 145 mmol/L   Potassium 4.1 3.5 - 5.1 mmol/L   Chloride 101 101 - 111 mmol/L   CO2 26 22 - 32 mmol/L   Glucose, Bld 121 (H) 65 - 99 mg/dL   BUN 20 6 - 20 mg/dL   Creatinine, Ser 1.41 (H) 0.61 - 1.24 mg/dL   Calcium 9.3 8.9 - 10.3 mg/dL   Total Protein 7.7 6.5 - 8.1 g/dL   Albumin 4.5 3.5 - 5.0 g/dL   AST 21 15 - 41 U/L   ALT 15 (L) 17 - 63 U/L   Alkaline Phosphatase 64 38 - 126 U/L   Total Bilirubin 0.9 0.3 - 1.2 mg/dL   GFR calc non Af Amer >60 >60 mL/min   GFR calc Af Amer >60 >60 mL/min    Comment: (NOTE) The eGFR has been calculated using the CKD EPI equation. This calculation has not been validated in all clinical situations. eGFR's persistently <60 mL/min signify possible Chronic Kidney Disease.    Anion gap 9 5 - 15  Lipase, blood     Status: Abnormal   Collection Time: 03/30/15 11:41 PM  Result Value Ref Range   Lipase 57 (H) 22 - 51 U/L  Protime-INR     Status: None   Collection Time: 03/30/15 11:41 PM  Result Value Ref Range   Prothrombin Time 14.2 11.6 - 15.2 seconds   INR 1.08 0.00 - 1.49  Urinalysis, Routine w reflex microscopic (not at [facility])     Status: Abnormal   Collection Time: 03/31/15  3:58 AM  Result Value Ref Range   Color, Urine YELLOW YELLOW   APPearance CLEAR CLEAR   Specific Gravity, Urine 1.041 (H) 1.005 - 1.030   pH 8.0 5.0 - 8.0   Glucose, UA NEGATIVE NEGATIVE mg/dL   Hgb urine dipstick NEGATIVE NEGATIVE   Bilirubin Urine NEGATIVE NEGATIVE   Ketones, ur NEGATIVE NEGATIVE mg/dL   Protein, ur NEGATIVE NEGATIVE mg/dL   Urobilinogen, UA 1.0 0.0 - 1.0 mg/dL   Nitrite NEGATIVE NEGATIVE   Leukocytes, UA NEGATIVE NEGATIVE    Comment: MICROSCOPIC NOT DONE ON URINES WITH NEGATIVE PROTEIN, BLOOD, LEUKOCYTES, NITRITE, OR GLUCOSE <1000 mg/dL.  Surgical pcr screen     Status: Abnormal   Collection Time: 03/31/15  4:36 AM  Result Value Ref Range    MRSA, PCR NEGATIVE NEGATIVE   Staphylococcus aureus POSITIVE (A) NEGATIVE    Comment:        The Xpert SA Assay (FDA approved for NASAL specimens in patients over 36 years of age), is one component of a comprehensive surveillance program.  Test performance has been validated by [facility] for patients greater  than or equal to 105 year old. It is not intended to diagnose infection nor to guide or monitor treatment.      RADIOLOGY RESULTS: Ct Abdomen Pelvis W Contrast  03/31/2015   CLINICAL DATA:  Diffuse abdominal pain  EXAM: CT ABDOMEN AND PELVIS WITH CONTRAST  TECHNIQUE: Multidetector CT imaging of the abdomen and pelvis was performed using the standard protocol following bolus administration of intravenous contrast.  CONTRAST:  92mL OMNIPAQUE IOHEXOL 300 MG/ML SOLN, 145mL OMNIPAQUE IOHEXOL 300 MG/ML SOLN  COMPARISON:  Radiographs 03/31/2015  FINDINGS: There is free intraperitoneal air. The source of the free air is not evident. The stomach and duodenum are well filled with oral contrast, and none of the contrast has leaked out into an extraluminal location. No focal inflammatory changes are evident. There is no bowel obstruction. There is a small volume ascites.  The parenchymal organs appear unremarkable.  There is no significant abnormality in the lower chest.  There is no significant musculoskeletal abnormality.  IMPRESSION: Free intraperitoneal air. Small volume ascites. These results were called by telephone at the time of interpretation on 03/31/2015 at 3:57 am to Dr. Shanon Rosser , who verbally acknowledged these results.   Electronically Signed   By: Andreas Newport M.D.   On: 03/31/2015 03:59   Dg Abd Acute W/chest  03/31/2015   CLINICAL DATA:  Abdominal pain, nausea, fever and vomiting for 1 day.  EXAM: DG ABDOMEN ACUTE W/ 1V CHEST  COMPARISON:  None.  FINDINGS: There is no evidence of dilated bowel loops or free intraperitoneal air. No radiopaque calculi or other significant  radiographic abnormality is seen. Heart size and mediastinal contours are within normal limits. Both lungs are clear except for a benign appearing calcified focus in the lateral left lung which may be a granuloma.  IMPRESSION: Negative abdominal radiographs.  No acute cardiopulmonary disease.   Electronically Signed   By: Andreas Newport M.D.   On: 03/31/2015 01:06    Problem List: Patient Active Problem List   Diagnosis Date Noted  . Free intraperitoneal air 03/31/2015    Assessment & Plan: Acute abdomen with free air by CT.  No stranding noted.  This could be a perforated ulcer that has walled off.  Will plan laparoscopy with possible laparotomy.    Matt B. Hassell Done, MD, [facility] [phone] beeper [phone]  03/31/2015 8:10 AM

## 2015-03-31 NOTE — ED Notes (Signed)
Per OR personnel, pt has not been added to schedule for Dr. Daphine DeutscherMartin.

## 2015-04-01 LAB — BASIC METABOLIC PANEL
Anion gap: 6 (ref 5–15)
BUN: 12 mg/dL (ref 6–20)
CALCIUM: 8.8 mg/dL — AB (ref 8.9–10.3)
CHLORIDE: 105 mmol/L (ref 101–111)
CO2: 29 mmol/L (ref 22–32)
CREATININE: 1.13 mg/dL (ref 0.61–1.24)
GFR calc Af Amer: >60 mL/min (ref >60)
GFR calc non Af Amer: >60 mL/min (ref >60)
GLUCOSE: 113 mg/dL — AB (ref 65–99)
POTASSIUM: 4.4 mmol/L (ref 3.5–5.1)
Sodium: 140 mmol/L (ref 135–145)

## 2015-04-01 LAB — CBC
HEMATOCRIT: 40 % (ref 39.0–52.0)
HEMOGLOBIN: 13.1 g/dL (ref 13.0–17.0)
MCH: 29.2 pg (ref 26.0–34.0)
MCHC: 32.8 g/dL (ref 30.0–36.0)
MCV: 89.3 fL (ref 78.0–100.0)
Platelets: 192 10*3/uL (ref 150–400)
RBC: 4.48 MIL/uL (ref 4.22–5.81)
RDW: 13.9 % (ref 11.5–15.5)
WBC: 10.1 10*3/uL (ref 4.0–10.5)

## 2015-04-01 NOTE — Progress Notes (Signed)
Utilization review completed.  

## 2015-04-01 NOTE — Progress Notes (Signed)
Patient ID: Dennis Cannon, male   DOB: [birth year], 29 y.o.   MRN: [id number] [facility] Surgery Progress Note:   1 Day Post-Op  Subjective: Mental status is clear.  Explained the findings and why he hasn't been given anything to drink.   Objective: Vital signs in last 24 hours: Temp:  [98 F (36.7 C)-99.3 F (37.4 C)] 98.2 F (36.8 C) (07/04 0520) Pulse Rate:  [46-89] 58 (07/04 0520) Resp:  [13-20] 18 (07/04 0520) BP: (123-138)/(70-83) 125/83 mmHg (07/04 0520) SpO2:  [100 %] 100 % (07/04 0520) Weight:  [85.5 kg (188 lb 7.9 oz)] 85.5 kg (188 lb 7.9 oz) (07/03 1610)  Intake/Output from previous day: 07/03 0701 - 07/04 0700 In: 2350 [I.V.:2350] Out: 2585 [Urine:2470; Drains:65; Blood:50] Intake/Output this shift:    Physical Exam: Work of breathing is normal.  Incisions OK.  JP sero sanguinous  Lab Results:  Results for orders placed or performed during the hospital encounter of 03/30/15 (from the past 48 hour(s))  CBC with Differential     Status: Abnormal   Collection Time: 03/30/15 11:41 PM  Result Value Ref Range   WBC 10.1 4.0 - 10.5 K/uL   RBC 5.23 4.22 - 5.81 MIL/uL   Hemoglobin 15.1 13.0 - 17.0 g/dL   HCT 45.5 39.0 - 52.0 %   MCV 87.0 78.0 - 100.0 fL   MCH 28.9 26.0 - 34.0 pg   MCHC 33.2 30.0 - 36.0 g/dL   RDW 13.3 11.5 - 15.5 %   Platelets 211 150 - 400 K/uL   Neutrophils Relative % 86 (H) 43 - 77 %   Neutro Abs 8.7 (H) 1.7 - 7.7 K/uL   Lymphocytes Relative 9 (L) 12 - 46 %   Lymphs Abs 0.9 0.7 - 4.0 K/uL   Monocytes Relative 5 3 - 12 %   Monocytes Absolute 0.5 0.1 - 1.0 K/uL   Eosinophils Relative 0 0 - 5 %   Eosinophils Absolute 0.0 0.0 - 0.7 K/uL   Basophils Relative 0 0 - 1 %   Basophils Absolute 0.0 0.0 - 0.1 K/uL  Comprehensive metabolic panel     Status: Abnormal   Collection Time: 03/30/15 11:41 PM  Result Value Ref Range   Sodium 136 135 - 145 mmol/L   Potassium 4.1 3.5 - 5.1 mmol/L   Chloride 101 101 - 111 mmol/L   CO2 26 22 - 32 mmol/L   Glucose, Bld 121 (H) 65 - 99 mg/dL   BUN 20 6 - 20 mg/dL   Creatinine, Ser 1.41 (H) 0.61 - 1.24 mg/dL   Calcium 9.3 8.9 - 10.3 mg/dL   Total Protein 7.7 6.5 - 8.1 g/dL   Albumin 4.5 3.5 - 5.0 g/dL   AST 21 15 - 41 U/L   ALT 15 (L) 17 - 63 U/L   Alkaline Phosphatase 64 38 - 126 U/L   Total Bilirubin 0.9 0.3 - 1.2 mg/dL   GFR calc non Af Amer >60 >60 mL/min   GFR calc Af Amer >60 >60 mL/min    Comment: (NOTE) The eGFR has been calculated using the CKD EPI equation. This calculation has not been validated in all clinical situations. eGFR's persistently <60 mL/min signify possible Chronic Kidney Disease.    Anion gap 9 5 - 15  Lipase, blood     Status: Abnormal   Collection Time: 03/30/15 11:41 PM  Result Value Ref Range   Lipase 57 (H) 22 - 51 U/L  Protime-INR     Status: None  Collection Time: 03/30/15 11:41 PM  Result Value Ref Range   Prothrombin Time 14.2 11.6 - 15.2 seconds   INR 1.08 0.00 - 1.49  Urinalysis, Routine w reflex microscopic (not at [facility])     Status: Abnormal   Collection Time: 03/31/15  3:58 AM  Result Value Ref Range   Color, Urine YELLOW YELLOW   APPearance CLEAR CLEAR   Specific Gravity, Urine 1.041 (H) 1.005 - 1.030   pH 8.0 5.0 - 8.0   Glucose, UA NEGATIVE NEGATIVE mg/dL   Hgb urine dipstick NEGATIVE NEGATIVE   Bilirubin Urine NEGATIVE NEGATIVE   Ketones, ur NEGATIVE NEGATIVE mg/dL   Protein, ur NEGATIVE NEGATIVE mg/dL   Urobilinogen, UA 1.0 0.0 - 1.0 mg/dL   Nitrite NEGATIVE NEGATIVE   Leukocytes, UA NEGATIVE NEGATIVE    Comment: MICROSCOPIC NOT DONE ON URINES WITH NEGATIVE PROTEIN, BLOOD, LEUKOCYTES, NITRITE, OR GLUCOSE <1000 mg/dL.  Surgical pcr screen     Status: Abnormal   Collection Time: 03/31/15  4:36 AM  Result Value Ref Range   MRSA, PCR NEGATIVE NEGATIVE   Staphylococcus aureus POSITIVE (A) NEGATIVE    Comment:        The Xpert SA Assay (FDA approved for NASAL specimens in patients over 30 years of age), is one component of a  comprehensive surveillance program.  Test performance has been validated by [facility] for patients greater than or equal to 29 year old. It is not intended to diagnose infection nor to guide or monitor treatment.   CBC     Status: Abnormal   Collection Time: 03/31/15  1:49 PM  Result Value Ref Range   WBC 11.6 (H) 4.0 - 10.5 K/uL   RBC 4.77 4.22 - 5.81 MIL/uL   Hemoglobin 13.9 13.0 - 17.0 g/dL   HCT 41.9 39.0 - 52.0 %   MCV 87.8 78.0 - 100.0 fL   MCH 29.1 26.0 - 34.0 pg   MCHC 33.2 30.0 - 36.0 g/dL   RDW 13.6 11.5 - 15.5 %   Platelets 217 150 - 400 K/uL  Creatinine, serum     Status: None   Collection Time: 03/31/15  1:49 PM  Result Value Ref Range   Creatinine, Ser 1.16 0.61 - 1.24 mg/dL   GFR calc non Af Amer >60 >60 mL/min   GFR calc Af Amer >60 >60 mL/min    Comment: (NOTE) The eGFR has been calculated using the CKD EPI equation. This calculation has not been validated in all clinical situations. eGFR's persistently <60 mL/min signify possible Chronic Kidney Disease.   CBC     Status: None   Collection Time: 04/01/15  4:32 AM  Result Value Ref Range   WBC 10.1 4.0 - 10.5 K/uL   RBC 4.48 4.22 - 5.81 MIL/uL   Hemoglobin 13.1 13.0 - 17.0 g/dL   HCT 40.0 39.0 - 52.0 %   MCV 89.3 78.0 - 100.0 fL   MCH 29.2 26.0 - 34.0 pg   MCHC 32.8 30.0 - 36.0 g/dL   RDW 13.9 11.5 - 15.5 %   Platelets 192 150 - 400 K/uL  Basic metabolic panel     Status: Abnormal   Collection Time: 04/01/15  4:32 AM  Result Value Ref Range   Sodium 140 135 - 145 mmol/L   Potassium 4.4 3.5 - 5.1 mmol/L   Chloride 105 101 - 111 mmol/L   CO2 29 22 - 32 mmol/L   Glucose, Bld 113 (H) 65 - 99 mg/dL   BUN  12 6 - 20 mg/dL   Creatinine, Ser 1.13 0.61 - 1.24 mg/dL   Calcium 8.8 (L) 8.9 - 10.3 mg/dL   GFR calc non Af Amer >60 >60 mL/min   GFR calc Af Amer >60 >60 mL/min    Comment: (NOTE) The eGFR has been calculated using the CKD EPI equation. This calculation has not been validated in all clinical  situations. eGFR's persistently <60 mL/min signify possible Chronic Kidney Disease.    Anion gap 6 5 - 15    Radiology/Results: Ct Abdomen Pelvis W Contrast  03/31/2015   CLINICAL DATA:  Diffuse abdominal pain  EXAM: CT ABDOMEN AND PELVIS WITH CONTRAST  TECHNIQUE: Multidetector CT imaging of the abdomen and pelvis was performed using the standard protocol following bolus administration of intravenous contrast.  CONTRAST:  32m OMNIPAQUE IOHEXOL 300 MG/ML SOLN, 1076mOMNIPAQUE IOHEXOL 300 MG/ML SOLN  COMPARISON:  Radiographs 03/31/2015  FINDINGS: There is free intraperitoneal air. The source of the free air is not evident. The stomach and duodenum are well filled with oral contrast, and none of the contrast has leaked out into an extraluminal location. No focal inflammatory changes are evident. There is no bowel obstruction. There is a small volume ascites.  The parenchymal organs appear unremarkable.  There is no significant abnormality in the lower chest.  There is no significant musculoskeletal abnormality.  IMPRESSION: Free intraperitoneal air. Small volume ascites. These results were called by telephone at the time of interpretation on 03/31/2015 at 3:57 am to Dr. JOShanon Rosser who verbally acknowledged these results.   Electronically Signed   By: DaAndreas Newport.D.   On: 03/31/2015 03:59   Dg Abd Acute W/chest  03/31/2015   CLINICAL DATA:  Abdominal pain, nausea, fever and vomiting for 1 day.  EXAM: DG ABDOMEN ACUTE W/ 1V CHEST  COMPARISON:  None.  FINDINGS: There is no evidence of dilated bowel loops or free intraperitoneal air. No radiopaque calculi or other significant radiographic abnormality is seen. Heart size and mediastinal contours are within normal limits. Both lungs are clear except for a benign appearing calcified focus in the lateral left lung which may be a granuloma.  IMPRESSION: Negative abdominal radiographs.  No acute cardiopulmonary disease.   Electronically Signed   By: DaAndreas Newport.D.   On: 03/31/2015 01:06    Anti-infectives: Anti-infectives    Start     Dose/Rate Route Frequency Ordered Stop   04/01/15 0400  ertapenem (INVANZ) 1 g in sodium chloride 0.9 % 50 mL IVPB     1 g 100 mL/hr over 30 Minutes Intravenous Every 24 hours 03/31/15 1323 04/01/15 0517   03/31/15 0445  ertapenem (INVANZ) 1 g in sodium chloride 0.9 % 50 mL IVPB     1 g 100 mL/hr over 30 Minutes Intravenous  Once 03/31/15 0422 03/31/15 0530      Assessment/Plan: Problem List: Patient Active Problem List   Diagnosis Date Noted  . Perforated duodenal ulcer 03/31/2015  . Perforated ulcer 03/31/2015    Doing well post lap closure and Graham patch of perforated duodenal ulcer.  UGI in am to assess before starting clear liquids.   1 Day Post-Op    LOS: 1 day   Matt B. MaHassell DoneMD, FAWestfield Hospitalurgery, P.A[facility] [phone] [phone]/12/2014 9:46 AM

## 2015-04-02 ENCOUNTER — Encounter (HOSPITAL_COMMUNITY): Payer: Self-pay | Admitting: Surgery

## 2015-04-02 ENCOUNTER — Inpatient Hospital Stay (HOSPITAL_COMMUNITY): Payer: BLUE CROSS/BLUE SHIELD

## 2015-04-02 LAB — CBC
HCT: 39.1 % (ref 39.0–52.0)
HEMOGLOBIN: 12.8 g/dL — AB (ref 13.0–17.0)
MCH: 29.2 pg (ref 26.0–34.0)
MCHC: 32.7 g/dL (ref 30.0–36.0)
MCV: 89.1 fL (ref 78.0–100.0)
Platelets: 200 10*3/uL (ref 150–400)
RBC: 4.39 MIL/uL (ref 4.22–5.81)
RDW: 14 % (ref 11.5–15.5)
WBC: 6.5 10*3/uL (ref 4.0–10.5)

## 2015-04-02 LAB — BASIC METABOLIC PANEL
ANION GAP: 5 (ref 5–15)
BUN: 12 mg/dL (ref 6–20)
CO2: 28 mmol/L (ref 22–32)
Calcium: 8.8 mg/dL — ABNORMAL LOW (ref 8.9–10.3)
Chloride: 105 mmol/L (ref 101–111)
Creatinine, Ser: 1.27 mg/dL — ABNORMAL HIGH (ref 0.61–1.24)
GFR calc Af Amer: >60 mL/min (ref >60)
GFR calc non Af Amer: >60 mL/min (ref >60)
GLUCOSE: 105 mg/dL — AB (ref 65–99)
POTASSIUM: 3.9 mmol/L (ref 3.5–5.1)
SODIUM: 138 mmol/L (ref 135–145)

## 2015-04-02 MED ORDER — IOHEXOL 300 MG/ML  SOLN
150.0000 mL | Freq: Once | INTRAMUSCULAR | Status: AC | PRN
  Administered 2015-04-02: 40 mL via ORAL

## 2015-04-02 MED ORDER — ACETAMINOPHEN 160 MG/5ML PO SOLN: 325.0000 mg | ORAL | Status: DC | PRN

## 2015-04-02 MED ORDER — OXYCODONE HCL 5 MG/5ML PO SOLN
5.0000 mg | ORAL | Status: DC | PRN
  Administered 2015-04-02 – 2015-04-03 (×4): 5 mg via ORAL
  Filled 2015-04-02 (×5): qty 5

## 2015-04-02 NOTE — Progress Notes (Signed)
Patient ID: Dennis Cannon, male   DOB: 08/21/1986, 29 y.o.   MRN: [id number]     [place]      [address] [place], [place] [address]    Phone: [phone] FAX: [phone]     Subjective: Passing flatus.  No n/v.  Ambulating in hallways.  VSS.  Afebrile.   Objective:  Vital signs:  Filed Vitals:   04/01/15 0900 04/01/15 1428 04/01/15 2119 04/02/15 0505  BP: 124/73 127/68 129/80 118/59  Pulse: 53 57 58 60  Temp: 98.3 F (36.8 C) 98.3 F (36.8 C) 99 F (37.2 C) 98.7 F (37.1 C)  TempSrc: Oral Oral Oral Oral  Resp: _0 Height:      Weight:      SpO2: 100% 100% 98% 100%    Last BM Date: 03/29/15  Intake/Output   Yesterday:  07/04 0701 - 07/05 0700 In: 2773.3 [I.V.:2773.3] Out: 2010 [QBHAL:9379; Drains:85] This shift:  Total I/O In: -  Out: 700 [Urine:700]  Physical Exam: General: Pt awake/alert/oriented x4 in no acute distress Chest: cta bilaterally.  No chest wall pain w good excursion CV:  Pulses intact.  Regular rhythm MS: Normal AROM mjr joints.  No obvious deformity Abdomen: Soft.  Nondistended.  Appropriately tender.  Rt drain with serous output. No evidence of peritonitis.  No incarcerated hernias. Ext:  No mjr edema.  No cyanosis Skin: No petechiae / purpura   Problem List:   Active Problems:   Perforated duodenal ulcer   Perforated ulcer    Results:   Labs: Results for orders placed or performed during the hospital encounter of 03/30/15 (from the past 48 hour(s))  CBC     Status: Abnormal   Collection Time: 03/31/15  1:49 PM  Result Value Ref Range   WBC 11.6 (H) 4.0 - 10.5 K/uL   RBC 4.77 4.22 - 5.81 MIL/uL   Hemoglobin 13.9 13.0 - 17.0 g/dL   HCT 41.9 39.0 - 52.0 %   MCV 87.8 78.0 - 100.0 fL   MCH 29.1 26.0 - 34.0 pg   MCHC 33.2 30.0 - 36.0 g/dL   RDW 13.6 11.5 - 15.5 %   Platelets 217 150 - 400 K/uL  Creatinine, serum     Status: None   Collection Time: 03/31/15  1:49  PM  Result Value Ref Range   Creatinine, Ser 1.16 0.61 - 1.24 mg/dL   GFR calc non Af Amer >60 >60 mL/min   GFR calc Af Amer >60 >60 mL/min    Comment: (NOTE) The eGFR has been calculated using the CKD EPI equation. This calculation has not been validated in all clinical situations. eGFR's persistently <60 mL/min signify possible Chronic Kidney Disease.   CBC     Status: None   Collection Time: 04/01/15  4:32 AM  Result Value Ref Range   WBC 10.1 4.0 - 10.5 K/uL   RBC 4.48 4.22 - 5.81 MIL/uL   Hemoglobin 13.1 13.0 - 17.0 g/dL   HCT 40.0 39.0 - 52.0 %   MCV 89.3 78.0 - 100.0 fL   MCH 29.2 26.0 - 34.0 pg   MCHC 32.8 30.0 - 36.0 g/dL   RDW 13.9 11.5 - 15.5 %   Platelets 192 150 - 400 K/uL  Basic metabolic panel     Status: Abnormal   Collection Time: 04/01/15  4:32 AM  Result Value Ref Range   Sodium 140 135 - 145 mmol/L   Potassium 4.4  3.5 - 5.1 mmol/L   Chloride 105 101 - 111 mmol/L   CO2 29 22 - 32 mmol/L   Glucose, Bld 113 (H) 65 - 99 mg/dL   BUN 12 6 - 20 mg/dL   Creatinine, Ser 1.13 0.61 - 1.24 mg/dL   Calcium 8.8 (L) 8.9 - 10.3 mg/dL   GFR calc non Af Amer >60 >60 mL/min   GFR calc Af Amer >60 >60 mL/min    Comment: (NOTE) The eGFR has been calculated using the CKD EPI equation. This calculation has not been validated in all clinical situations. eGFR's persistently <60 mL/min signify possible Chronic Kidney Disease.    Anion gap 6 5 - 15  CBC     Status: Abnormal   Collection Time: 04/02/15  4:21 AM  Result Value Ref Range   WBC 6.5 4.0 - 10.5 K/uL   RBC 4.39 4.22 - 5.81 MIL/uL   Hemoglobin 12.8 (L) 13.0 - 17.0 g/dL   HCT 39.1 39.0 - 52.0 %   MCV 89.1 78.0 - 100.0 fL   MCH 29.2 26.0 - 34.0 pg   MCHC 32.7 30.0 - 36.0 g/dL   RDW 14.0 11.5 - 15.5 %   Platelets 200 150 - 400 K/uL  Basic metabolic panel     Status: Abnormal   Collection Time: 04/02/15  4:21 AM  Result Value Ref Range   Sodium 138 135 - 145 mmol/L   Potassium 3.9 3.5 - 5.1 mmol/L   Chloride  105 101 - 111 mmol/L   CO2 28 22 - 32 mmol/L   Glucose, Bld 105 (H) 65 - 99 mg/dL   BUN 12 6 - 20 mg/dL   Creatinine, Ser 1.27 (H) 0.61 - 1.24 mg/dL   Calcium 8.8 (L) 8.9 - 10.3 mg/dL   GFR calc non Af Amer >60 >60 mL/min   GFR calc Af Amer >60 >60 mL/min    Comment: (NOTE) The eGFR has been calculated using the CKD EPI equation. This calculation has not been validated in all clinical situations. eGFR's persistently <60 mL/min signify possible Chronic Kidney Disease.    Anion gap 5 5 - 15    Imaging / Studies: No results found.  Medications / Allergies:  Scheduled Meds: . heparin  5,000 Units Subcutaneous 3 times per day  . pantoprazole (PROTONIX) IV  40 mg Intravenous Q12H   Continuous Infusions: . dextrose 5 % and 0.45 % NaCl with KCl 20 mEq/L 100 mL/hr at 04/01/15 1033   PRN Meds:.morphine injection, ondansetron **OR** ondansetron (ZOFRAN) IV  Antibiotics: Anti-infectives    Start     Dose/Rate Route Frequency Ordered Stop   04/01/15 0400  ertapenem (INVANZ) 1 g in sodium chloride 0.9 % 50 mL IVPB     1 g 100 mL/hr over 30 Minutes Intravenous Every 24 hours 03/31/15 1323 04/01/15 0517   03/31/15 0445  ertapenem (INVANZ) 1 g in sodium chloride 0.9 % 50 mL IVPB     1 g 100 mL/hr over 30 Minutes Intravenous  Once 03/31/15 0422 03/31/15 0530        Assessment/Plan Laparoscopic closure of perforated duodenal ulcer-UGI today, if negative for a leak, start on clears(no pork products) and transition to PO pain meds(liquid).  PPI, mobilize and IS. -c/w drain(serous 70m). VTE prophylaxis-Heparin/SCD AKI-mild, stable, follow BMP and UOP.  FEN-IVF Dispo-pending UGI   EErby Pian AMs [facility] Pager [phone](7A-4:30P)   04/02/2015 9:08 AM

## 2015-04-02 NOTE — Progress Notes (Signed)
Patient ID: Dennis Cannon, male   DOB: 03/08/1986, 29 y.o.   MRN: [id number]  Discussed heparin being a port derivative.  Would like to stop and will do so.  Understands he needs to mobilize and use SCDs when in bed to minimize risk of DVTs.  Aryelle Figg, ANP-BC

## 2015-04-03 LAB — BASIC METABOLIC PANEL
ANION GAP: 4 — AB (ref 5–15)
BUN: 8 mg/dL (ref 6–20)
CALCIUM: 8.8 mg/dL — AB (ref 8.9–10.3)
CHLORIDE: 106 mmol/L (ref 101–111)
CO2: 29 mmol/L (ref 22–32)
Creatinine, Ser: 1.15 mg/dL (ref 0.61–1.24)
GFR calc Af Amer: >60 mL/min (ref >60)
GFR calc non Af Amer: >60 mL/min (ref >60)
GLUCOSE: 103 mg/dL — AB (ref 65–99)
Potassium: 3.9 mmol/L (ref 3.5–5.1)
Sodium: 139 mmol/L (ref 135–145)

## 2015-04-03 MED ORDER — MORPHINE SULFATE 2 MG/ML IJ SOLN: 1.0000 mg | INTRAMUSCULAR | Status: DC | PRN

## 2015-04-03 NOTE — Progress Notes (Signed)
[facility] Progress Note  3 Days Post-Op  Subjective: Pt doing very well.  Having flatus, no BM yet.  Ambulating well with little assistance.  Tolerating clears well, very hungry and thirsty.  Pain well controlled.    Objective: Vital signs in last 24 hours: Temp:  [98.4 F (36.9 C)-98.8 F (37.1 C)] 98.4 F (36.9 C) (07/06 0450) Pulse Rate:  [53-99] 56 (07/06 0450) Resp:  [16-18] 18 (07/06 0450) BP: (118-119)/(70-74) 118/74 mmHg (07/06 0450) SpO2:  [93 %-100 %] 100 % (07/06 0450) Last BM Date: 03/29/15  Intake/Output from previous day: 07/05 0701 - 07/06 0700 In: 2400 [I.V.:2400] Out: 3170 [Urine:3125; Drains:45] Intake/Output this shift:    PE: Gen:  Alert, NAD, pleasant Abd: Soft, mild distension and pain around JP drain in RLQ, +BS, no HSM, incisions C/D/I, drain with minimal serosanguinous drainage   Lab Results:   Recent Labs  04/01/15 0432 04/02/15 0421  WBC 10.1 6.5  HGB 13.1 12.8*  HCT 40.0 39.1  PLT 192 200   BMET  Recent Labs  04/02/15 0421 04/03/15 0433  NA 138 139  K 3.9 3.9  CL 105 106  CO2 28 29  GLUCOSE 105* 103*  BUN 12 8  CREATININE 1.27* 1.15  CALCIUM 8.8* 8.8*   PT/INR No results for input(s): LABPROT, INR in the last 72 hours. CMP     Component Value Date/Time   NA 139 04/03/2015 0433   K 3.9 04/03/2015 0433   CL 106 04/03/2015 0433   CO2 29 04/03/2015 0433   GLUCOSE 103* 04/03/2015 0433   BUN 8 04/03/2015 0433   CREATININE 1.15 04/03/2015 0433   CALCIUM 8.8* 04/03/2015 0433   PROT 7.7 03/30/2015 2341   ALBUMIN 4.5 03/30/2015 2341   AST 21 03/30/2015 2341   ALT 15* 03/30/2015 2341   ALKPHOS 64 03/30/2015 2341   BILITOT 0.9 03/30/2015 2341   GFRNONAA >60 04/03/2015 0433   GFRAA >60 04/03/2015 0433   Lipase     Component Value Date/Time   LIPASE 57* 03/30/2015 2341       Studies/Results: Dg Ugi W/water Sol Cm  04/02/2015   CLINICAL DATA:  Recent repair of perforated duodenal ulcer.  EXAM: WATER  SOLUBLE UPPER GI SERIES  TECHNIQUE: Single-column upper GI series was performed using water soluble contrast.  CONTRAST:  40mL OMNIPAQUE IOHEXOL 300 MG/ML  SOLN  COMPARISON:  Radiographs and CT 03/31/2015.  FLUOROSCOPY TIME:  Radiation Exposure Index (as provided by the fluoroscopic device):  If the device does not provide the exposure index:  Fluoroscopy Time (in minutes and seconds):  49 seconds  Number of Acquired Images: 5 non fluoroscopic images (including scout images).  FINDINGS: The scout abdominal radiograph demonstrates residual contrast material within the right colon. There is no definite residual pneumoperitoneum. The patient swallowed the contrast without difficulty. The esophageal motility is normal. The stomach fills and empties normally. There is no evidence of residual duodenal perforation.  IMPRESSION: Normal water-soluble upper GI series. No evidence of residual duodenal perforation.   Electronically Signed   By: Carey Bullocks M.D.   On: 04/02/2015 10:34    Anti-infectives: Anti-infectives    Start     Dose/Rate Route Frequency Ordered Stop   04/01/15 0400  ertapenem (INVANZ) 1 g in sodium chloride 0.9 % 50 mL IVPB     1 g 100 mL/hr over 30 Minutes Intravenous Every 24 hours 03/31/15 1323 04/01/15 0517   03/31/15 0445  ertapenem (INVANZ) 1 g in sodium chloride  0.9 % 50 mL IVPB     1 g 100 mL/hr over 30 Minutes Intravenous  Once 03/31/15 0422 03/31/15 0530       Assessment/Plan Perforated duodenal ulcer POD #3 s/p Laparoscopic closure (Dr. Daphine DeutscherMartin) -UGI 04/02/15 negative for a leak, started on clears. Advance to fulls at breakfast and soft tomorrow am if tolerating -PPI, mobilize and IS. -c/w drain (serosanguinous 6445ml/24hr). VTE prophylaxis-SCD, patient refusing heparin as it is a pork derivative, encouraged aggressive ambulation and leg exercises AKI-resolved FEN-IVF Dispo-home when tolerating solid diet and pain well controlled    LOS: 3 days    Nonie HoyerMegan N  Maude Gloor 04/03/2015, 8:05 AM Pager: [phone]

## 2015-04-04 MED ORDER — CLARITHROMYCIN 250 MG/5ML PO SUSR
500.0000 mg | Freq: Two times a day (BID) | ORAL | Status: DC
  Administered 2015-04-04: 500 mg via ORAL
  Filled 2015-04-04 (×2): qty 10

## 2015-04-04 MED ORDER — OXYCODONE-ACETAMINOPHEN 5-325 MG PO TABS
1.0000 | ORAL_TABLET | ORAL | Status: DC | PRN
Start: 2015-04-04 — End: 2015-04-04
  Administered 2015-04-04: 1 via ORAL
  Filled 2015-04-04: qty 1

## 2015-04-04 MED ORDER — AMOXICILLIN 250 MG/5ML PO SUSR
1000.0000 mg | Freq: Two times a day (BID) | ORAL | Status: DC
  Administered 2015-04-04: 1000 mg via ORAL
  Filled 2015-04-04 (×2): qty 20

## 2015-04-04 MED ORDER — AMOXICILLIN 250 MG/5ML PO SUSR: 1000.0000 mg | Freq: Two times a day (BID) | ORAL | Status: AC

## 2015-04-04 MED ORDER — PANTOPRAZOLE SODIUM 40 MG PO TBEC: 40.0000 mg | DELAYED_RELEASE_TABLET | Freq: Every day | ORAL | Status: DC

## 2015-04-04 MED ORDER — CLARITHROMYCIN 250 MG/5ML PO SUSR: 500.0000 mg | Freq: Two times a day (BID) | ORAL | Status: AC

## 2015-04-04 MED ORDER — OXYCODONE-ACETAMINOPHEN 5-325 MG PO TABS: 1.0000 | ORAL_TABLET | Freq: Four times a day (QID) | ORAL | Status: AC | PRN

## 2015-04-04 MED ORDER — PANTOPRAZOLE SODIUM 40 MG PO TBEC: 40.0000 mg | DELAYED_RELEASE_TABLET | Freq: Every day | ORAL | Status: AC

## 2015-04-04 NOTE — Plan of Care (Signed)
Problem: Food- and Nutrition-Related Knowledge Deficit (NB-1.1) Goal: Nutrition education Formal process to instruct or train a patient/client in a skill or to impart knowledge to help patients/clients voluntarily manage or modify food choices and eating behavior to maintain or improve health. Outcome: Completed/Met Date Met:  04/04/15 Nutrition Education Note  RD consulted for nutrition education regarding peptic ulcer diet.  RD provided "Peptic Ulcer Nutrition Therapy" handout from the Academy of [employer]etics. Reviewed foods recommended and not recommended. Discouraged intake of caffeine and alcohol. Encouraged lean protein sources. Encouraged fresh fruits and vegetables. Teach back method used.  Expect good compliance. Pt very engaged in education, took notes on his phone and asked many questions.  Body mass index is 24.19 kg/(m^2). Pt meets criteria for normal range based on current BMI.  Current diet order is soft. Labs and medications reviewed. No further nutrition interventions warranted at this time.  If additional nutrition issues arise, please re-consult RD.   Clayton Bibles, MS, RD, LDN Pager: [phone] After Hours Pager: [phone]

## 2015-04-04 NOTE — Care Management Note (Signed)
Case Management Note  Patient Details  Name: Collie Siadbdel Nickelson MRN: [id number] Date of Birth: 04/05/1986  Subjective/Objective:            Laparoscopic closure of perforated duodenal ulcer        Action/Plan: Discharge planning, consulted for medication assistance. Discussed with patient at bedside, patient states he just started a job and was not sure he had medication coverage. Verified insurance coverage with BCBS, instructed patient to contact employer for pharmacy benefit group number. Patient instructed can take scripts to any pharmacy with BCBS benefits information to obtain meds. Patient appreciative of assistance. No further d/c needs identified.   Expected Discharge Date:                  Expected Discharge Plan:  Home/Self Care  In-House Referral:  NA  Discharge planning Services  CM Consult, Medication Assistance  Status of Service:  Completed, signed off  Medicare Important Message Given:    Date Medicare IM Given:    Medicare IM give by:    Date Additional Medicare IM Given:    Additional Medicare Important Message give by:     If discussed at Long Length of Stay Meetings, dates discussed:    Additional Comments:  Alexis Goodelleele, Arnell Mausolf K, RN 04/04/2015, 10:29 AM

## 2015-04-04 NOTE — Anesthesia Postprocedure Evaluation (Signed)
  Anesthesia Post-op Note  Patient: Dennis Cannon  Procedure(s) Performed: Procedure(s) (LRB): LAPAROSCOPIC ABDOMINAL EXPLORATION (N/A)  Patient Location: PACU  Anesthesia Type: General  Level of Consciousness: awake and alert   Airway and Oxygen Therapy: Patient Spontanous Breathing  Post-op Pain: mild  Post-op Assessment: Post-op Vital signs reviewed, Patient's Cardiovascular Status Stable, Respiratory Function Stable, Patent Airway and No signs of Nausea or vomiting  Last Vitals:  Filed Vitals:   04/04/15 1414  BP: 119/48  Pulse: 68  Temp: 36.7 C  Resp: 18    Post-op Vital Signs: stable   Complications: No apparent anesthesia complications

## 2015-04-04 NOTE — Discharge Instructions (Signed)
LAPAROSCOPIC SURGERY: POST OP INSTRUCTIONS  1. DIET: Follow a light bland diet the first 24 hours after arrival home, such as soup, liquids, crackers, etc. Be sure to include lots of fluids daily. Avoid fast food or heavy meals as your are more likely to get nauseated. Eat a low fat the next few days after surgery.  2. Take your usually prescribed home medications unless otherwise directed. 3. PAIN CONTROL:  1. Pain is best controlled by a usual combination of three different methods TOGETHER:  1. Ice/Heat 2. Over the counter pain medication 3. Prescription pain medication 2. Most patients will experience some swelling and bruising around the incisions. Ice packs or heating pads (30-60 minutes up to 6 times a day) will help. Use ice for the first few days to help decrease swelling and bruising, then switch to heat to help relax tight/sore spots and speed recovery. Some people prefer to use ice alone, heat alone, alternating between ice & heat. Experiment to what works for you. Swelling and bruising can take several weeks to resolve.  3. It is helpful to take an over-the-counter pain medication regularly for the first few weeks. Choose one of the following that works best for you:  1. Naproxen (Aleve, etc) Two 220mg  tabs twice a day 2. Ibuprofen (Advil, etc) Three 200mg  tabs four times a day (every meal & bedtime) 3. Acetaminophen (Tylenol, etc) 500-650mg  four times a day (every meal & bedtime) 4. A prescription for pain medication (such as oxycodone, hydrocodone, etc) should be given to you upon discharge. Take your pain medication as prescribed.  1. If you are having problems/concerns with the prescription medicine (does not control pain, nausea, vomiting, rash, itching, etc), please call us [phone] to see if we need to switch you to a different pain medicine that will work better for you and/or control your side effect better. 2. If you need a refill on your pain medication, please  contact your pharmacy. They will contact our office to request authorization. Prescriptions will not be filled after 5 pm or on week-ends. 4. Avoid getting constipated. Between the surgery and the pain medications, it is common to experience some constipation. Increasing fluid intake and taking a fiber supplement (such as Metamucil, Citrucel, FiberCon, MiraLax, etc) 1-2 times a day regularly will usually help prevent this problem from occurring. A mild laxative (prune juice, Milk of Magnesia, MiraLax, etc) should be taken according to package directions if there are no bowel movements after 48 hours.  5. Watch out for diarrhea. If you have many loose bowel movements, simplify your diet to bland foods & liquids for a few days. Stop any stool softeners and decrease your fiber supplement. Switching to mild anti-diarrheal medications (Kayopectate, Pepto Bismol) can help. If this worsens or does not improve, please call us. 6. Wash / shower every day. You may shower over the dressings as they are waterproof. Continue to shower over incision(s) after the dressing is off. 7. Remove your waterproof bandages 5 days after surgery. You may leave the incision open to air. You may replace a dressing/Band-Aid to cover the incision for comfort if you wish.  8. ACTIVITIES as tolerated:  1. You may resume regular (light) daily activities beginning the next day--such as daily self-care, walking, climbing stairs--gradually increasing activities as tolerated. If you can walk 30 minutes without difficulty, it is safe to try more intense activity such as jogging, treadmill, bicycling, low-impact aerobics, swimming, etc. 2. Save the most intensive and strenuous activity  for last such as sit-ups, heavy lifting, contact sports, etc Refrain from any heavy lifting or straining until you are off narcotics for pain control.  3. DO NOT PUSH THROUGH PAIN. Let pain be your guide: If it hurts to do something, don't do it. Pain is your body  warning you to avoid that activity for another week until the pain goes down. 4. You may drive when you are no longer taking prescription pain medication, you can comfortably wear a seatbelt, and you can safely maneuver your car and apply brakes. 5. You may have sexual intercourse when it is comfortable.  9. FOLLOW UP in our office  1. Please call [facility] at [phone] to set up an appointment to see your surgeon in the office for a follow-up appointment approximately 2-3 weeks after your surgery. 2. Make sure that you call for this appointment the day you arrive home to insure a convenient appointment time.      10. IF YOU HAVE DISABILITY OR FAMILY LEAVE FORMS, BRING THEM TO THE               OFFICE FOR PROCESSING.   WHEN TO CALL us [phone]:  1. Poor pain control 2. Reactions / problems with new medications (rash/itching, nausea, etc)  3. Fever over 101.5 F (38.5 C) 4. Inability to urinate 5. Nausea and/or vomiting 6. Worsening swelling or bruising 7. Continued bleeding from incision. 8. Increased pain, redness, or drainage from the incision  The clinic staff is available to answer your questions during regular business hours (8:30am-5pm). Please dont hesitate to call and ask to speak to one of our nurses for clinical concerns.  If you have a medical emergency, go to the nearest emergency room or call 911.  A surgeon from [facility] is always on call at the [facility], [place]  [address], [place], [place] [address] ?  MAIN: [phone] ? TOLL FREE: [phone] ?  FAX [phone]  [email or web address]  Food Choices for Peptic Ulcer Disease When you have peptic ulcer disease, the foods you eat and your eating habits are very important. Choosing the right foods can help ease the discomfort of peptic ulcer disease. WHAT GENERAL GUIDELINES DO I NEED TO FOLLOW?  Choose fruits, vegetables, whole grains, and  low-fat meat, fish, and poultry.   Keep a food diary to identify foods that cause symptoms.  Avoid foods that cause irritation or pain. These may be different for different people.  Eat frequent small meals instead of three large meals each day. The pain may be worse when your stomach is empty.  Avoid eating close to bedtime. WHAT FOODS ARE NOT RECOMMENDED? The following are some foods and drinks that may worsen your symptoms:  Black, white, and red pepper.  Hot sauce.  Chili peppers.  Chili powder.  Chocolate and cocoa.   Alcohol.  Tea, coffee, and cola (regular and decaffeinated). The items listed above may not be a complete list of foods and beverages to avoid. Contact your dietitian for more information. Document Released: 12/07/2011 Document Revised: 09/19/2013 Document Reviewed: 07/19/2013 [facility] Patient Information 2015 [place], [place]. This information is not intended to replace advice given to you by your health care provider. Make sure you discuss any questions you have with your health care provider.   Peptic Ulcer A peptic ulcer is a sore in the lining of your esophagus (esophageal ulcer), stomach (gastric ulcer), or in the first part  of your small intestine (duodenal ulcer). The ulcer causes erosion into the deeper tissue. CAUSES  Normally, the lining of the stomach and the small intestine protects itself from the acid that digests food. The protective lining can be damaged by:  An infection caused by a bacterium called Helicobacter pylori (H. pylori).  Regular use of nonsteroidal anti-inflammatory drugs (NSAIDs), such as ibuprofen or aspirin.  Smoking tobacco. Other risk factors include being older than 50, drinking alcohol excessively, and having a family history of ulcer disease.  SYMPTOMS   Burning pain or gnawing in the area between the chest and the belly button.  Heartburn.  Nausea and vomiting.  Bloating. The pain can be worse on an empty  stomach and at night. If the ulcer results in bleeding, it can cause:  Black, tarry stools.  Vomiting of bright red blood.  Vomiting of coffee-ground-looking materials. DIAGNOSIS  A diagnosis is usually made based upon your history and an exam. Other tests and procedures may be performed to find the cause of the ulcer. Finding a cause will help determine the best treatment. Tests and procedures may include:  Blood tests, stool tests, or breath tests to check for the bacterium H. pylori.  An upper gastrointestinal (GI) series of the esophagus, stomach, and small intestine.  An endoscopy to examine the esophagus, stomach, and small intestine.  A biopsy. TREATMENT  Treatment may include:  Eliminating the cause of the ulcer, such as smoking, NSAIDs, or alcohol.  Medicines to reduce the amount of acid in your digestive tract.  Antibiotic medicines if the ulcer is caused by the H. pylori bacterium.  An upper endoscopy to treat a bleeding ulcer.  Surgery if the bleeding is severe or if the ulcer created a hole somewhere in the digestive system. HOME CARE INSTRUCTIONS   Avoid tobacco, alcohol, and caffeine. Smoking can increase the acid in the stomach, and continued smoking will impair the healing of ulcers.  Avoid foods and drinks that seem to cause discomfort or aggravate your ulcer.  Only take medicines as directed by your caregiver. Do not substitute over-the-counter medicines for prescription medicines without talking to your caregiver.  Keep any follow-up appointments and tests as directed. SEEK MEDICAL CARE IF:   Your do not improve within 7 days of starting treatment.  You have ongoing indigestion or heartburn. SEEK IMMEDIATE MEDICAL CARE IF:   You have sudden, sharp, or persistent abdominal pain.  You have bloody or dark black, tarry stools.  You vomit blood or vomit that looks like coffee grounds.  You become light-headed, weak, or feel faint.  You become sweaty  or clammy. MAKE SURE YOU:   Understand these instructions.  Will watch your condition.  Will get help right away if you are not doing well or get worse. Document Released: 09/11/2000 Document Revised: 01/29/2014 Document Reviewed: 04/13/2012 [facility] Patient Information 2015 [place], [place]. This information is not intended to replace advice given to you by your health care provider. Make sure you discuss any questions you have with your health care provider.  Food Choices for Peptic Ulcer Disease When you have peptic ulcer disease, the foods you eat and your eating habits are very important. Choosing the right foods can help ease the discomfort of peptic ulcer disease. WHAT GENERAL GUIDELINES DO I NEED TO FOLLOW?  Choose fruits, vegetables, whole grains, and low-fat meat, fish, and poultry.   Keep a food diary to identify foods that cause symptoms.  Avoid foods that cause irritation or pain.  These may be different for different people.  Eat frequent small meals instead of three large meals each day. The pain may be worse when your stomach is empty.  Avoid eating close to bedtime. WHAT FOODS ARE NOT RECOMMENDED? The following are some foods and drinks that may worsen your symptoms:  Black, white, and red pepper.  Hot sauce.  Chili peppers.  Chili powder.  Chocolate and cocoa.   Alcohol.  Tea, coffee, and cola (regular and decaffeinated). The items listed above may not be a complete list of foods and beverages to avoid. Contact your dietitian for more information. Document Released: 12/07/2011 Document Revised: 09/19/2013 Document Reviewed: 07/19/2013 [facility] Patient Information 2015 [place], [place]. This information is not intended to replace advice given to you by your health care provider. Make sure you discuss any questions you have with your health care provider.

## 2015-04-04 NOTE — Progress Notes (Signed)
Pt's vitals are WNL, has no complaints of pain and is tolerating diet. Discussed discharge instructions and diet. Patient understood and has no comments nor concerns. Patient to be discharged to home

## 2015-04-04 NOTE — Discharge Summary (Signed)
[facility] Discharge Summary   Patient ID: Dennis Cannon MRN: [id number] DOB/AGE: 27/11/1985 27 y.o.  Admit date: 03/30/2015 Discharge date: 04/04/2015  Admitting Diagnosis: Perforated duodenal ucler  Discharge Diagnosis Patient Active Problem List   Diagnosis Date Noted  . Perforated duodenal ulcer 03/31/2015  . Perforated ulcer 03/31/2015    Consultants None  Imaging: Dg Ugi W/water Sol Cm  04/02/2015   CLINICAL DATA:  Recent repair of perforated duodenal ulcer.  EXAM: WATER SOLUBLE UPPER GI SERIES  TECHNIQUE: Single-column upper GI series was performed using water soluble contrast.  CONTRAST:  40mL OMNIPAQUE IOHEXOL 300 MG/ML  SOLN  COMPARISON:  Radiographs and CT 03/31/2015.  FLUOROSCOPY TIME:  Radiation Exposure Index (as provided by the fluoroscopic device):  If the device does not provide the exposure index:  Fluoroscopy Time (in minutes and seconds):  49 seconds  Number of Acquired Images: 5 non fluoroscopic images (including scout images).  FINDINGS: The scout abdominal radiograph demonstrates residual contrast material within the right colon. There is no definite residual pneumoperitoneum. The patient swallowed the contrast without difficulty. The esophageal motility is normal. The stomach fills and empties normally. There is no evidence of residual duodenal perforation.  IMPRESSION: Normal water-soluble upper GI series. No evidence of residual duodenal perforation.   Electronically Signed   By: Carey BullocksWilliam  Veazey M.D.   On: 04/02/2015 10:34    Procedures Dr. Daphine DeutscherMartin (03/31/15) - Laparoscopic repair of perforated duodenal ulcer  Hospital Course:  29 y.o. male African man who has been fasting for [place]. Over the last several days he has developed increasing abdominal pain. He describes it as generalized. His bowel habits are to have BMs 2-3 times/week. He doesn't eat much. Denies alcohol or NSAID use.  No h/o GERD.  Found to have free air. He decided to go the  route of surgery which included laparoscopy and possible laparotomy.  Workup showed perforated duodenal ulcer.  He denies alcohol and NSAID use, concern for h.pylori.  Patient was admitted and underwent procedure listed above.  Tolerated procedure well and was transferred to the floor.  On 04/02/15 an UGI was completed and showed no leak, thus diet was advanced as tolerated.  The dietitian was consulted regarding PUD diet recommendations.  On POD #4, the patient was voiding well, tolerating diet, ambulating well, pain well controlled, vital signs stable, incisions c/d/i with dermabond in place and felt stable for discharge home.  Lovenox was initially given, but then held secondary to religious practices and not eating pork.  He was ambulating well on his own.  JP drain was removed prior to discharge due to low serosanguinous output.  Patient will follow up in our office in 2-3 weeks and knows to call with questions or concerns.  Case manager contacted for help with prescriptions as he will be going home on prophylactic h.pylori treatment including amoxicillin, clarithromycin, and Protonix.  Stool antigen pending.  He has some difficulty swallowing pills, so I've given him liquid for the larger pills.   Physical Exam: General:  Alert, NAD, pleasant, comfortable Abd:  Soft, ND, mild tenderness, incisions C/D/I, drain with minimal serosanguinous drainage (removed prior to discharge)    Medication List    STOP taking these medications        predniSONE 10 MG tablet  Commonly known as:  DELTASONE     terbinafine 250 MG tablet  Commonly known as:  LAMISIL      TAKE these medications        amoxicillin 250  MG/5ML suspension  Commonly known as:  AMOXIL  Take 20 mLs (1,000 mg total) by mouth every 12 (twelve) hours.     cetirizine 10 MG tablet  Commonly known as:  ZYRTEC  Take 1 tablet (10 mg total) by mouth daily.     clarithromycin 250 MG/5ML suspension  Commonly known as:  BIAXIN  Take 10  mLs (500 mg total) by mouth every 12 (twelve) hours.     oxyCODONE-acetaminophen 5-325 MG per tablet  Commonly known as:  PERCOCET/ROXICET  Take 1-2 tablets by mouth every 6 (six) hours as needed for moderate pain or severe pain.     pantoprazole 40 MG tablet  Commonly known as:  PROTONIX  Take 1 tablet (40 mg total) by mouth daily before breakfast.  Start taking on:  04/05/2015     ranitidine 150 MG tablet  Commonly known as:  ZANTAC  Take 150 mg by mouth once.         Follow-up Information    Follow up with Luretha Murphy B, MD. Schedule an appointment as soon as possible for a visit in 3 weeks.   Specialty:  General Surgery   Why:  Call office to confirm date/time of appointment, For post-operation check   Contact information:   [address] [place] [address] [phone]       Signed: Nonie Hoyer, [facility] [phone]  04/04/2015, 9:24 AM

## 2015-04-05 LAB — H. PYLORI ANTIBODY, IGG: H Pylori IgG: >8 U/mL — ABNORMAL HIGH (ref 0.0–0.8)

## 2017-03-04 IMAGING — CT CT ABD-PELV W/ CM
2 of 4 series · 16 of 46 positions shown, 18 images · IV contrast (omnipaque)
Comparison: Radiographs 03/31/2015

CLINICAL DATA: Diffuse abdominal pain

EXAM:
CT ABDOMEN AND PELVIS WITH CONTRAST
TECHNIQUE: Multidetector CT imaging of the abdomen and pelvis was performed
using the standard protocol following bolus administration of
intravenous contrast.
CONTRAST:  50mL OMNIPAQUE IOHEXOL 300 MG/ML SOLN, 100mL OMNIPAQUE
IOHEXOL 300 MG/ML SOLN

[Series 2: abd/pel with · axial · 0.72mm/px · z∈[-487,-52]mm · 13 of 95 slices shown, 15 images]
[im 4/95  soft-tissue]
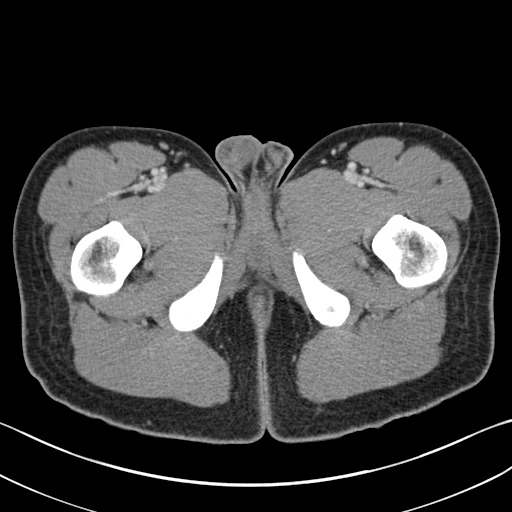
[im 4/95  bone]
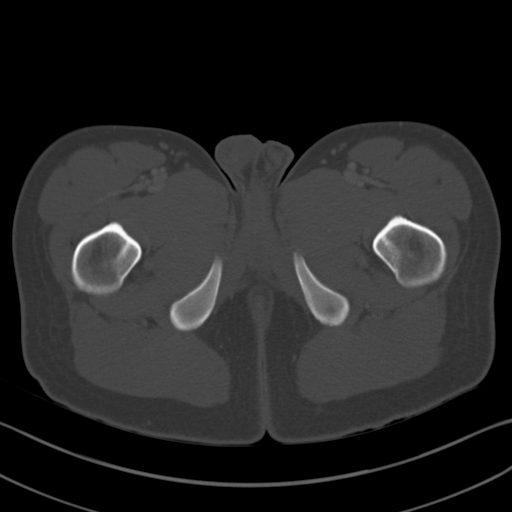
[im 12/95  soft-tissue]
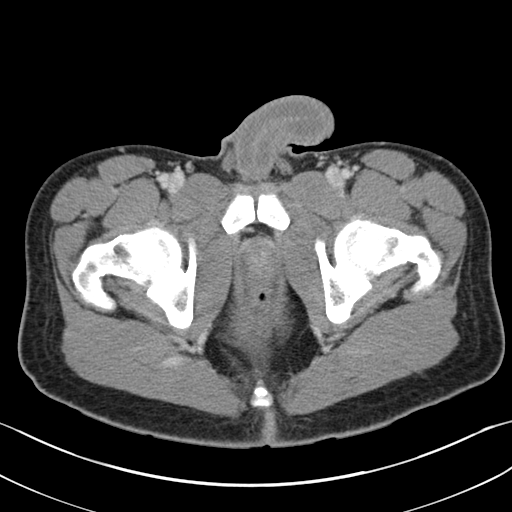
[im 19/95  soft-tissue]
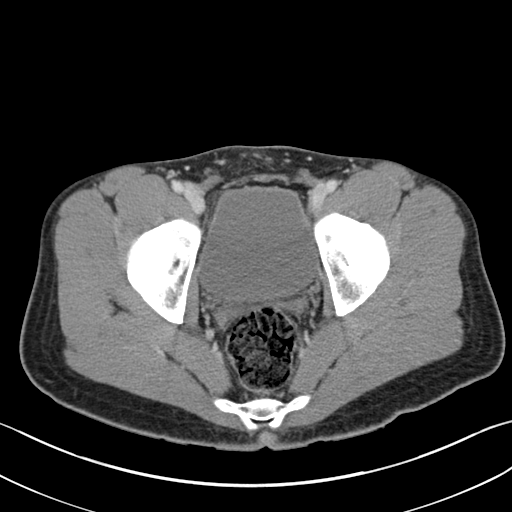
[im 27/95  soft-tissue]
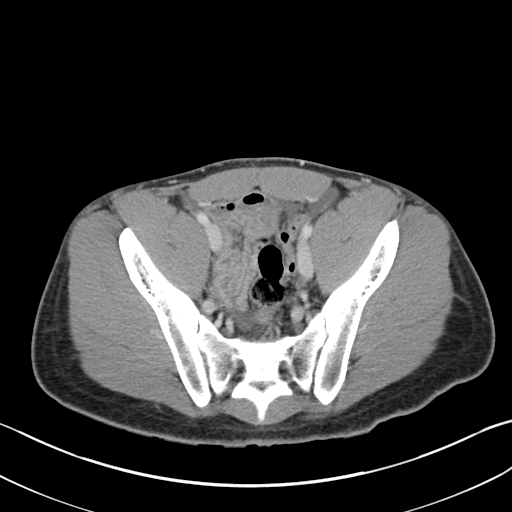
[im 34/95  soft-tissue]
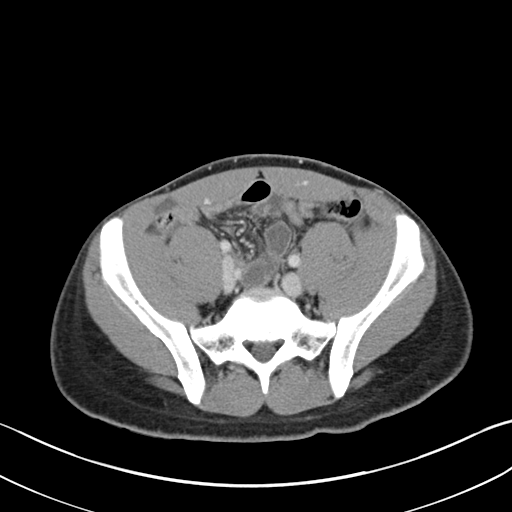
[im 42/95  soft-tissue]
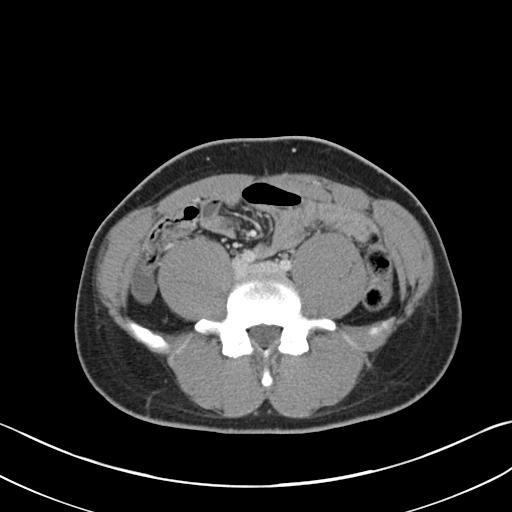
[im 49/95  soft-tissue]
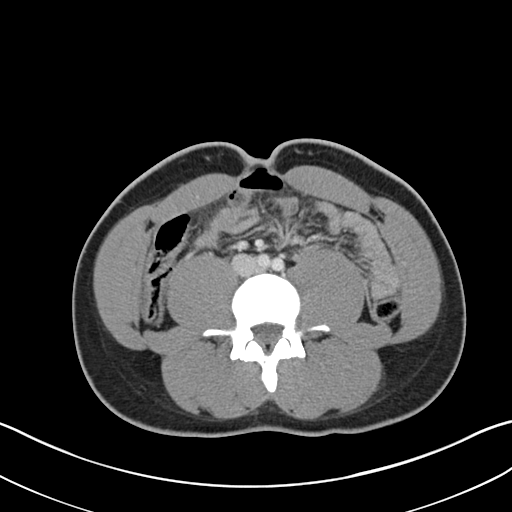
[im 53/95  soft-tissue]
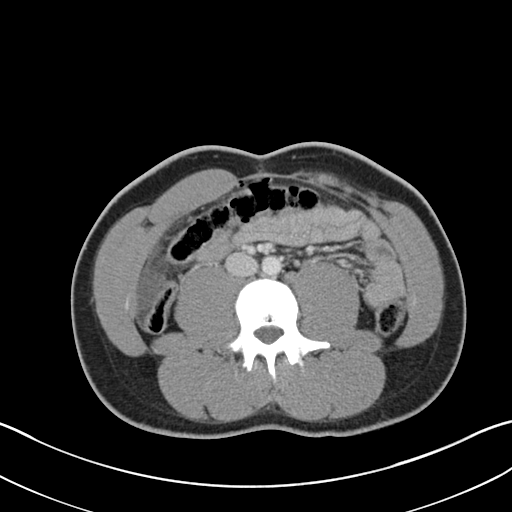
[im 61/95  soft-tissue]
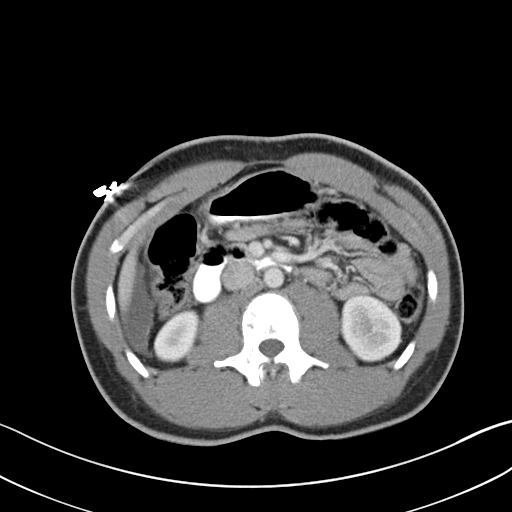
[im 61/95  bone]
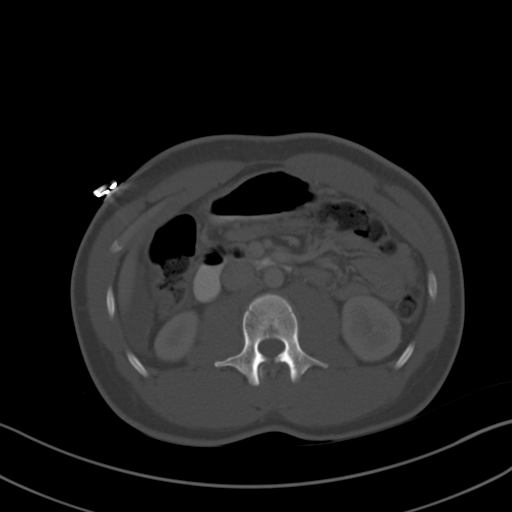
[im 68/95  soft-tissue]
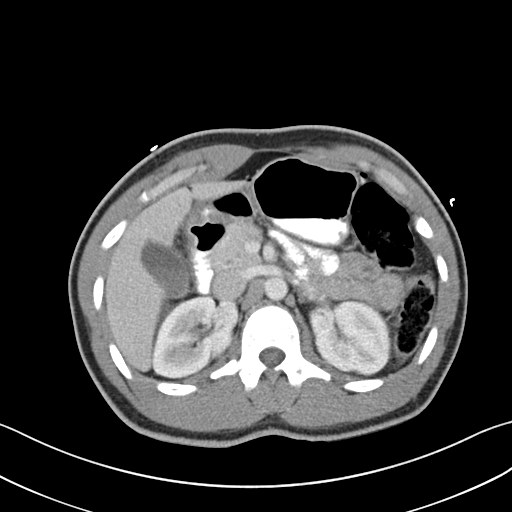
[im 76/95  soft-tissue]
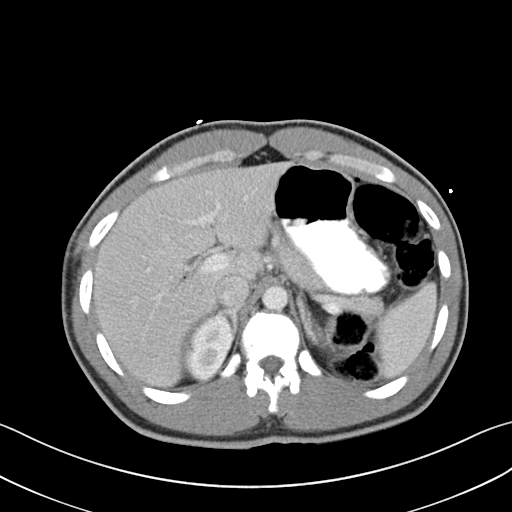
[im 83/95  soft-tissue]
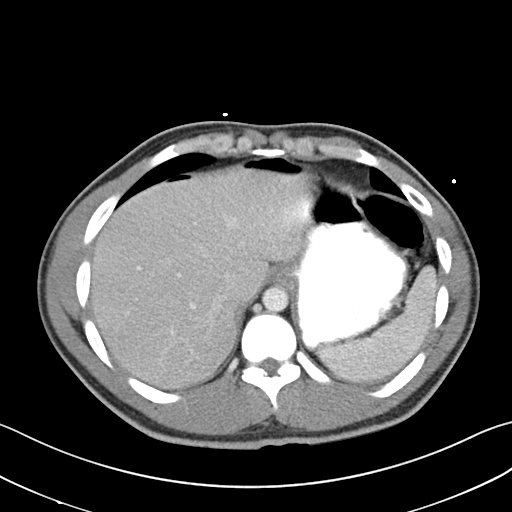
[im 91/95  soft-tissue]
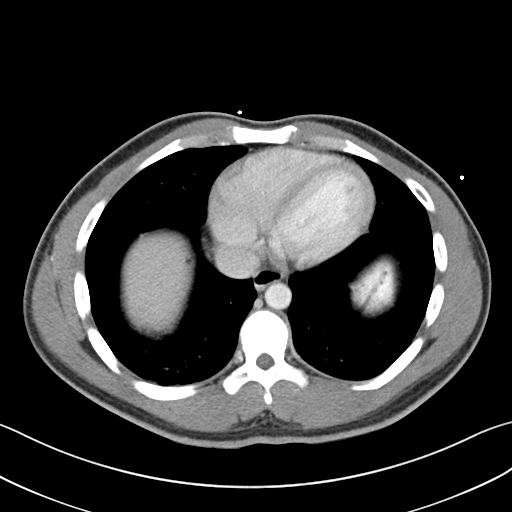

[Series 5: coronal a/|p · coronal · 0.68mm/px · 3 of 76 slices shown]
[im 26/76  soft-tissue]
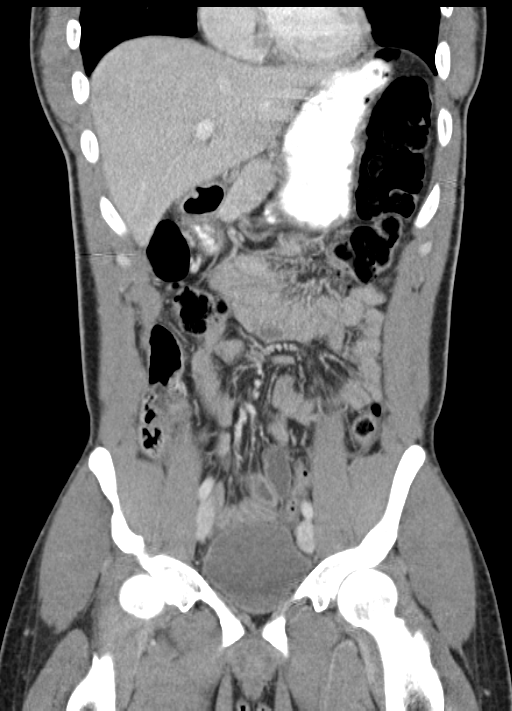
[im 34/76  soft-tissue]
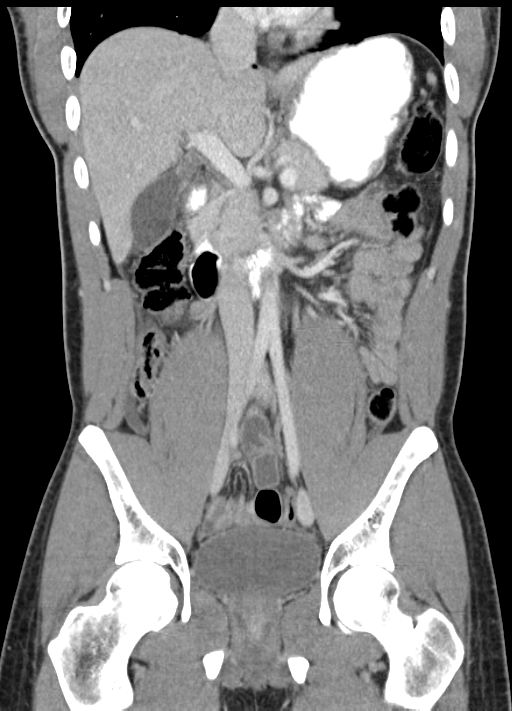
[im 42/76  soft-tissue]
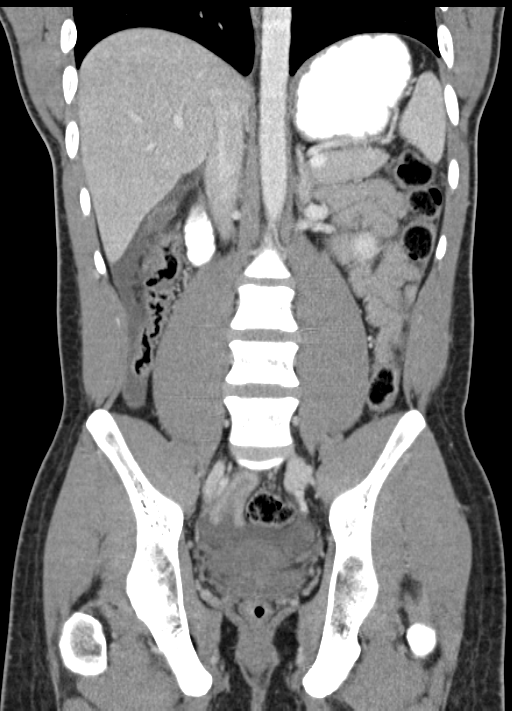

[16 of 46 positions shown; findings below may reference images not displayed]

FINDINGS: There is free intraperitoneal air. The source of the free air is not
evident. The stomach and duodenum are well filled with oral
contrast, and none of the contrast has leaked out into an
extraluminal location. No focal inflammatory changes are evident.
There is no bowel obstruction. There is a small volume ascites.

The parenchymal organs appear unremarkable.

There is no significant abnormality in the lower chest.

There is no significant musculoskeletal abnormality.
IMPRESSION: Free intraperitoneal air. Small volume ascites. These results were
called by telephone at the time of interpretation on 03/31/2015 at
[DATE] to Dr. GASTON ANTONIO BINIMELIS , who verbally acknowledged these
results.

## 2017-03-06 IMAGING — CR DG UGI W/ GASTROGRAFIN
2 series · 2 of 2 positions shown · IV contrast (omnipaque)
Comparison: Radiographs and CT 03/31/2015.

CLINICAL DATA: Recent repair of perforated duodenal ulcer.

EXAM:
WATER SOLUBLE UPPER GI SERIES
TECHNIQUE: Single-column upper GI series was performed using water soluble
contrast.
CONTRAST:  40mL OMNIPAQUE IOHEXOL 300 MG/ML  SOLN

[view not recorded (1 of 2)]
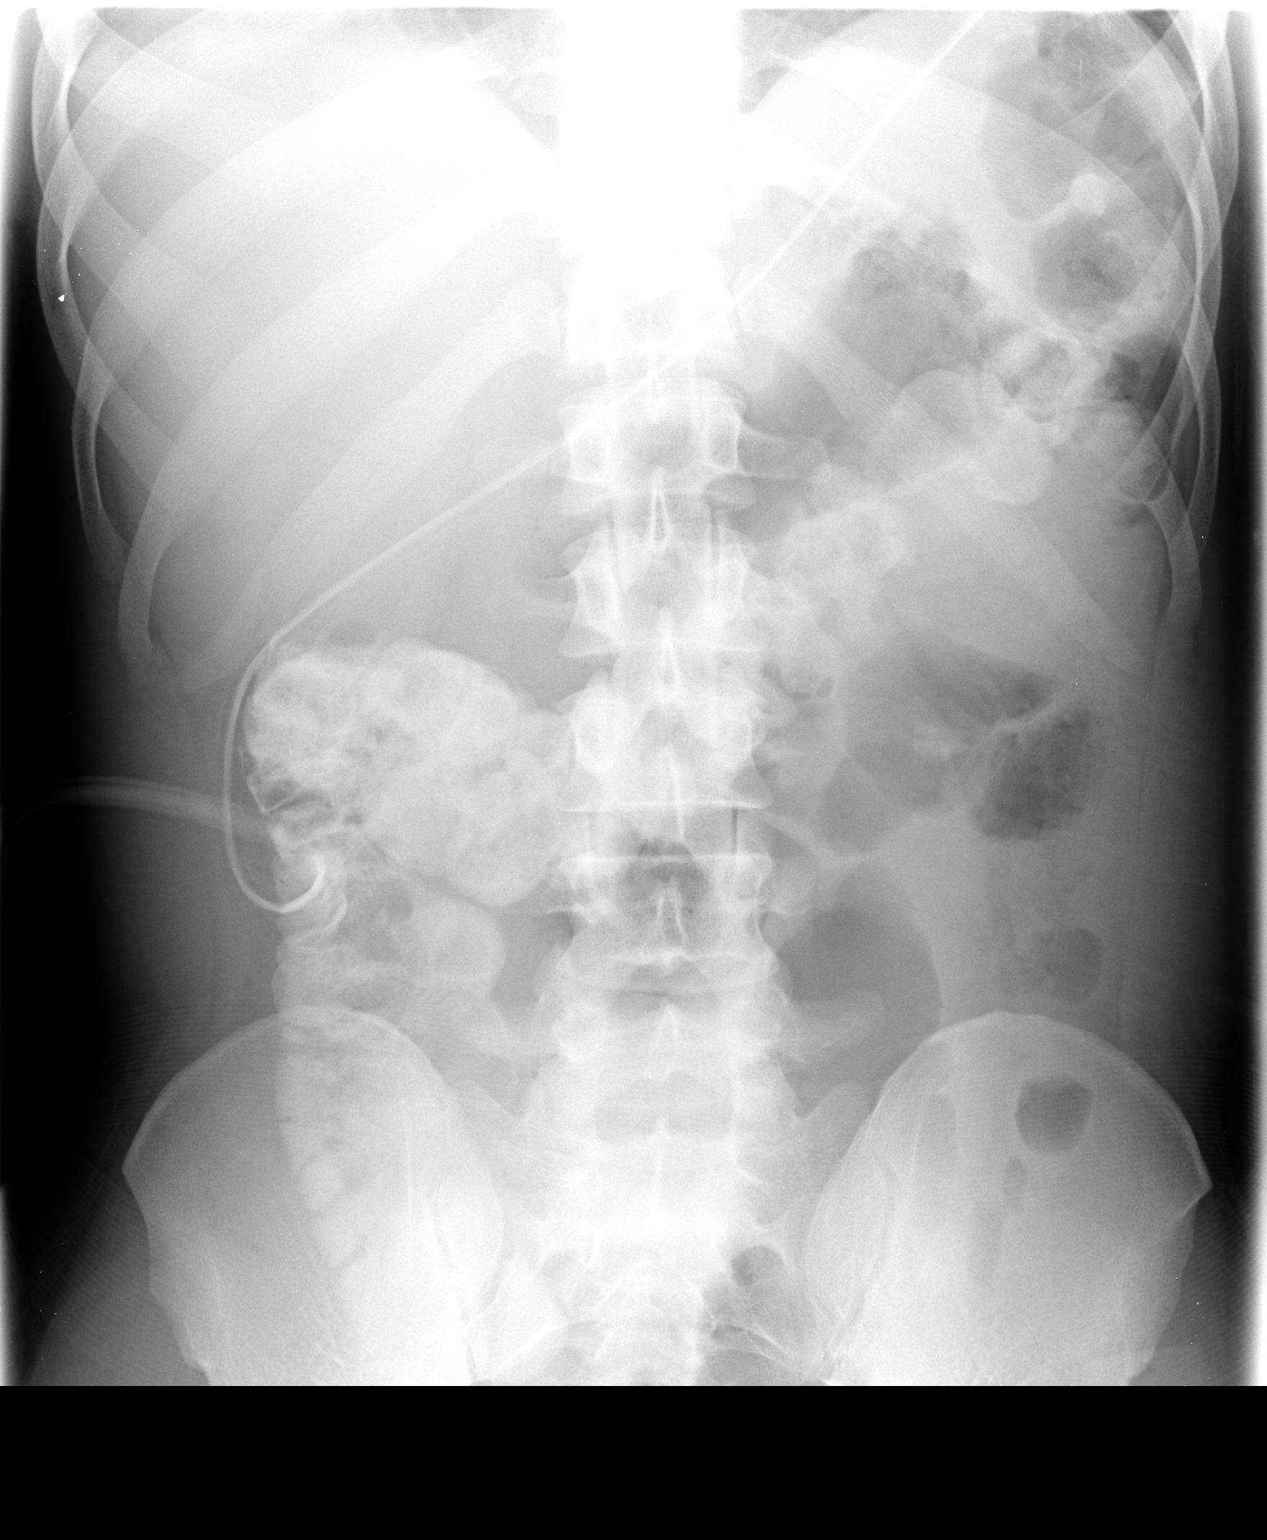

[view not recorded (2 of 2)]
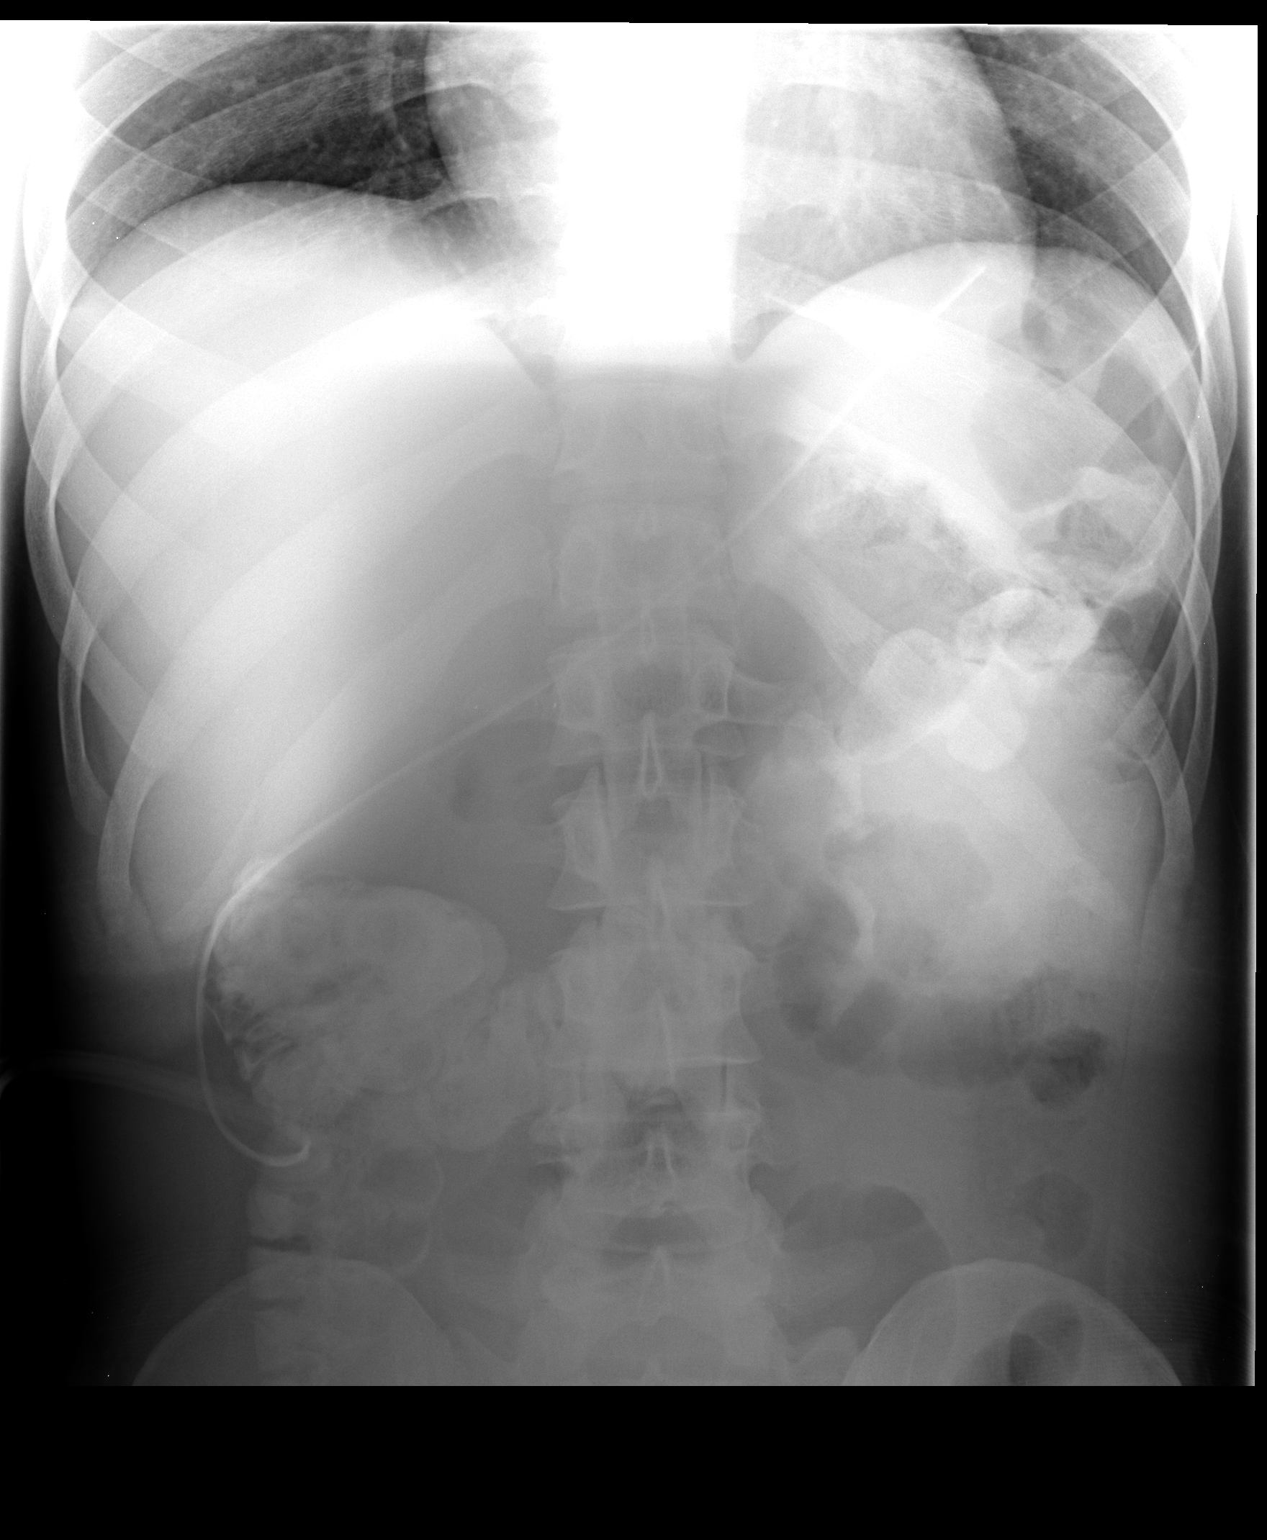

[2 of 2 positions shown; findings below may reference images not displayed]

FLUOROSCOPY TIME:  Radiation Exposure Index (as provided by the
fluoroscopic device):

If the device does not provide the exposure index:

Fluoroscopy Time (in minutes and seconds):  49 seconds

Number of Acquired Images: 5 non fluoroscopic images (including
scout images).
FINDINGS: The scout abdominal radiograph demonstrates residual contrast
material within the right colon. There is no definite residual
pneumoperitoneum. The patient swallowed the contrast without
difficulty. The esophageal motility is normal. The stomach fills and
empties normally. There is no evidence of residual duodenal
perforation.
IMPRESSION: Normal water-soluble upper GI series. No evidence of residual
duodenal perforation.
